# Patient Record
Sex: Male | Born: 1970 | Race: White | Hispanic: Yes | Marital: Married | Smoking: Never smoker
Health system: Southern US, Community
[De-identification: ages and names within clinical notes are randomized; demographics above are authoritative.]

## PROBLEM LIST (undated history)

## (undated) DIAGNOSIS — A048 Other specified bacterial intestinal infections: Secondary | ICD-10-CM

## (undated) HISTORY — DX: Other specified bacterial intestinal infections: A04.8

---

## 1997-09-10 HISTORY — PX: OTHER SURGICAL HISTORY: SHX169

## 2001-10-11 DIAGNOSIS — A048 Other specified bacterial intestinal infections: Secondary | ICD-10-CM

## 2001-10-11 HISTORY — DX: Other specified bacterial intestinal infections: A04.8

## 2001-11-03 ENCOUNTER — Encounter: Admission: RE | Admit: 2001-11-03 | Discharge: 2001-11-03 | Payer: Self-pay | Admitting: Family Medicine

## 2001-12-01 ENCOUNTER — Encounter: Admission: RE | Admit: 2001-12-01 | Discharge: 2001-12-01 | Payer: Self-pay | Admitting: Family Medicine

## 2002-02-02 ENCOUNTER — Encounter: Admission: RE | Admit: 2002-02-02 | Discharge: 2002-02-02 | Payer: Self-pay | Admitting: Family Medicine

## 2002-02-23 ENCOUNTER — Encounter: Admission: RE | Admit: 2002-02-23 | Discharge: 2002-02-23 | Payer: Self-pay | Admitting: Family Medicine

## 2002-08-10 ENCOUNTER — Encounter: Admission: RE | Admit: 2002-08-10 | Discharge: 2002-08-10 | Payer: Self-pay | Admitting: Family Medicine

## 2003-08-03 ENCOUNTER — Encounter: Admission: RE | Admit: 2003-08-03 | Discharge: 2003-08-03 | Payer: Self-pay | Admitting: Family Medicine

## 2003-10-11 ENCOUNTER — Encounter: Admission: RE | Admit: 2003-10-11 | Discharge: 2003-10-11 | Payer: Self-pay | Admitting: Family Medicine

## 2004-01-05 ENCOUNTER — Encounter: Admission: RE | Admit: 2004-01-05 | Discharge: 2004-01-05 | Payer: Self-pay | Admitting: Family Medicine

## 2004-05-09 ENCOUNTER — Encounter: Admission: RE | Admit: 2004-05-09 | Discharge: 2004-05-09 | Payer: Self-pay | Admitting: Family Medicine

## 2006-03-25 ENCOUNTER — Ambulatory Visit: Payer: Self-pay | Admitting: Family Medicine

## 2006-11-07 DIAGNOSIS — K219 Gastro-esophageal reflux disease without esophagitis: Secondary | ICD-10-CM | POA: Insufficient documentation

## 2007-06-02 ENCOUNTER — Encounter (INDEPENDENT_AMBULATORY_CARE_PROVIDER_SITE_OTHER): Payer: Self-pay | Admitting: *Deleted

## 2007-10-27 ENCOUNTER — Ambulatory Visit: Payer: Self-pay | Admitting: Family Medicine

## 2007-10-27 DIAGNOSIS — M549 Dorsalgia, unspecified: Secondary | ICD-10-CM | POA: Insufficient documentation

## 2007-11-24 ENCOUNTER — Ambulatory Visit: Payer: Self-pay | Admitting: Family Medicine

## 2007-11-24 DIAGNOSIS — B351 Tinea unguium: Secondary | ICD-10-CM | POA: Insufficient documentation

## 2007-11-24 DIAGNOSIS — H43399 Other vitreous opacities, unspecified eye: Secondary | ICD-10-CM | POA: Insufficient documentation

## 2007-11-24 LAB — CONVERTED CEMR LAB
Chlamydia, Swab/Urine, PCR: NEGATIVE
Cholesterol: 200 mg/dL (ref 0–200)
GC Probe Amp, Urine: NEGATIVE
HDL: 49 mg/dL (ref >39)
LDL Cholesterol: 121 mg/dL — ABNORMAL HIGH (ref 0–99)
Triglycerides: 150 mg/dL — ABNORMAL HIGH (ref <150)

## 2007-11-25 ENCOUNTER — Encounter: Payer: Self-pay | Admitting: Family Medicine

## 2008-03-16 ENCOUNTER — Ambulatory Visit: Payer: Self-pay | Admitting: Family Medicine

## 2008-03-16 DIAGNOSIS — J1189 Influenza due to unidentified influenza virus with other manifestations: Secondary | ICD-10-CM | POA: Insufficient documentation

## 2008-03-17 ENCOUNTER — Emergency Department (HOSPITAL_COMMUNITY): Admission: EM | Admit: 2008-03-17 | Discharge: 2008-03-17 | Payer: Self-pay | Admitting: Emergency Medicine

## 2008-06-18 ENCOUNTER — Ambulatory Visit: Payer: Self-pay | Admitting: Family Medicine

## 2009-07-14 ENCOUNTER — Encounter: Payer: Self-pay | Admitting: Family Medicine

## 2009-07-14 ENCOUNTER — Ambulatory Visit (HOSPITAL_COMMUNITY): Admission: RE | Admit: 2009-07-14 | Discharge: 2009-07-14 | Payer: Self-pay | Admitting: Family Medicine

## 2009-07-14 ENCOUNTER — Ambulatory Visit: Payer: Self-pay | Admitting: Family Medicine

## 2009-07-14 DIAGNOSIS — R079 Chest pain, unspecified: Secondary | ICD-10-CM | POA: Insufficient documentation

## 2009-07-21 ENCOUNTER — Encounter: Payer: Self-pay | Admitting: Family Medicine

## 2010-10-15 ENCOUNTER — Encounter: Payer: Self-pay | Admitting: *Deleted

## 2012-01-21 ENCOUNTER — Encounter: Payer: Self-pay | Admitting: Family Medicine

## 2012-01-22 NOTE — Progress Notes (Signed)
Opened by accident. Can you close this?

## 2012-01-22 NOTE — Progress Notes (Signed)
This encounter was created in error - please disregard.

## 2012-02-05 ENCOUNTER — Ambulatory Visit (INDEPENDENT_AMBULATORY_CARE_PROVIDER_SITE_OTHER): Payer: Self-pay | Admitting: Family Medicine

## 2012-02-05 ENCOUNTER — Encounter: Payer: Self-pay | Admitting: Family Medicine

## 2012-02-05 VITALS — BP 114/80 | HR 65 | Temp 98.3°F | Ht 65.0 in | Wt 137.0 lb

## 2012-02-05 DIAGNOSIS — K044 Acute apical periodontitis of pulpal origin: Secondary | ICD-10-CM

## 2012-02-05 DIAGNOSIS — K047 Periapical abscess without sinus: Secondary | ICD-10-CM | POA: Insufficient documentation

## 2012-02-05 MED ORDER — PENICILLIN V POTASSIUM 500 MG PO TABS
500.0000 mg | ORAL_TABLET | Freq: Three times a day (TID) | ORAL | Status: AC
Start: 2012-02-05 — End: 2012-02-15

## 2012-02-05 NOTE — Assessment & Plan Note (Signed)
Infected wisdom tooth

## 2012-02-05 NOTE — Patient Instructions (Addendum)
Tome toda la penicilina para 10 dia. Continue Acetaminophen for pain.  Entre una consulta con la dentista. Si mejora la infeccion completamente puede cancelarla cita. Hay una cola larga para visita con la dentista  We will enter a consult with the dentist. Call to cancel it if your tooth infection goes away.

## 2012-02-05 NOTE — Progress Notes (Signed)
  Subjective:    Patient ID: Luke Sloan, male    DOB: 08/22/1971, 41 y.o.   MRN: [id number]  HPI Three weeks of left lower posterior molar pain. Felt something pop and pus drained inside.Mild jaw swelling has decreased.  right lower molar has a filling, but isn't painful.  No fever or chills  Out of work from furniture delivery  Review of Systems     Objective:   Physical Exam  Constitutional: He appears well-developed and well-nourished.  HENT:  Right Ear: External ear normal.  Left Ear: External ear normal.       left lower 3rd molar is without apparent caries, but has purulence and inflammation on the lateral side. No operculum  Cardiovascular: Normal rate and regular rhythm.   Pulmonary/Chest: Effort normal and breath sounds normal.  Lymphadenopathy:    He has no cervical adenopathy.  Psychiatric: He has a normal mood and affect.          Assessment & Plan:

## 2012-02-21 ENCOUNTER — Telehealth: Payer: Self-pay | Admitting: Family Medicine

## 2012-02-21 NOTE — Telephone Encounter (Signed)
Called pt and informed that I did fax a referral to the Dental Clinic.  Lorenda Hatchet, Renato Battles

## 2012-02-21 NOTE — Telephone Encounter (Signed)
Patient now has the [facility] card and needs a referral to the dental clinic. He has a toothache and needs to be seen asap.

## 2012-03-12 ENCOUNTER — Telehealth: Payer: Self-pay | Admitting: *Deleted

## 2012-03-12 ENCOUNTER — Encounter: Payer: Self-pay | Admitting: Family Medicine

## 2012-03-12 ENCOUNTER — Ambulatory Visit (INDEPENDENT_AMBULATORY_CARE_PROVIDER_SITE_OTHER): Payer: Self-pay | Admitting: Family Medicine

## 2012-03-12 VITALS — BP 107/72 | HR 69 | Temp 97.6°F | Ht 65.0 in | Wt 143.0 lb

## 2012-03-12 DIAGNOSIS — K044 Acute apical periodontitis of pulpal origin: Secondary | ICD-10-CM

## 2012-03-12 DIAGNOSIS — K047 Periapical abscess without sinus: Secondary | ICD-10-CM

## 2012-03-12 MED ORDER — CLINDAMYCIN HCL 300 MG PO CAPS: 300.0000 mg | ORAL_CAPSULE | Freq: Four times a day (QID) | ORAL | Status: DC

## 2012-03-12 MED ORDER — CLINDAMYCIN HCL 300 MG PO CAPS: 300.0000 mg | ORAL_CAPSULE | Freq: Four times a day (QID) | ORAL | Status: AC

## 2012-03-12 NOTE — Assessment & Plan Note (Addendum)
Given patient's report of pus and drainage from inflamed area, as well as with mouth exam showing significant inflammation around the left lower molar, dental abscess is likely. As such will start patient on clindamycin 300mg  qid for 3 weeks or longer if needed. He will need to be seen by the dental clinic very soon to get this further evaluated.  Patient to continue with ibuprofen and tylenol for analgesia.  Patient to return if any fever or increased swelling and draining

## 2012-03-12 NOTE — Progress Notes (Signed)
Patient ID: Luke Sloan, male   DOB: 06-Nov-1970, 41 y.o.   MRN: [id number] Patient ID: Luke Sloan    DOB: 1971/07/29, 41 y.o.   MRN: [id number] --- Subjective:  Luke Sloan is a 41 y.o.male who presents with left sided tooth pain.   Time period of: 4 weeks of left lower molar pain. Has been treated with penicillin x 10 days. Finished course on Wednesday. Recurrence of pain since Friday. Swelling since Sunday. Drained pus from swollen area yesterday. + chills. No objective fever. Ibuprofen and tylenol with minimal relief. Left sided headache associated with this.   ROS: see HPI Past Medical History: reviewed and updated medications and allergies.   Objective: Filed Vitals:   03/12/12 1003  BP: 107/72  Pulse: 69  Temp: 97.6 F (36.4 C)    Physical Examination:   General: no acute distress, appears stated age Mouth: left lower gingival tissue inflamed and swollen and tender with palpation. No swelling or fluctuence appreciated along buccal wall. Filling in place in left lower first molar. No obvious cavity appreciated.

## 2012-03-12 NOTE — Telephone Encounter (Signed)
Called dental clinic and left detailed message. Re: pt's dental referral. It was faxed on 02-21-12. Pt finished ABX and needs urgent appointment. [phone]). Waiting for call back. Lorenda Hatchet, Renato Battles

## 2012-03-17 ENCOUNTER — Telehealth: Payer: Self-pay | Admitting: *Deleted

## 2012-03-17 NOTE — Telephone Encounter (Signed)
Spoke with Asher Muir at the [facility] and faxed the referral to her attention. They will look into it. Please let pt know. Lorenda Hatchet, Renato Battles

## 2012-03-18 NOTE — Telephone Encounter (Signed)
I spoke with wife, [facility] contacted pt and pt has an appt on 03/31/12. Thank you very much Thekla, Pt is very happy with our services.  Marines

## 2012-06-09 ENCOUNTER — Ambulatory Visit (INDEPENDENT_AMBULATORY_CARE_PROVIDER_SITE_OTHER): Payer: Self-pay | Admitting: Family Medicine

## 2012-06-09 ENCOUNTER — Encounter: Payer: Self-pay | Admitting: Family Medicine

## 2012-06-09 VITALS — BP 120/77 | HR 64 | Temp 98.5°F | Ht 65.0 in | Wt 139.0 lb

## 2012-06-09 DIAGNOSIS — Z23 Encounter for immunization: Secondary | ICD-10-CM

## 2012-06-09 DIAGNOSIS — Z0000 Encounter for general adult medical examination without abnormal findings: Secondary | ICD-10-CM

## 2012-06-09 NOTE — Progress Notes (Signed)
  Subjective:    Patient ID: Luke Sloan, male    DOB: May 05, 1971, 41 y.o.   MRN: [id number]  HPI He would like a general check-up. No acute problems.    Review of Systems  Constitutional: Negative for fever and activity change.  HENT: Negative for hearing loss, congestion and rhinorrhea.   Respiratory: Negative for apnea.   Cardiovascular: Negative for chest pain.  Gastrointestinal: Negative for abdominal pain.  Genitourinary: Negative for dysuria.  Musculoskeletal: Negative for arthralgias.  Skin: Negative for rash.  Neurological: Negative for syncope.  Hematological: Negative for adenopathy.  Psychiatric/Behavioral: Negative for disturbed wake/sleep cycle.       Objective:   Physical Exam  Constitutional: He is oriented to person, place, and time. He appears well-developed and well-nourished.  HENT:  Head: Normocephalic.  Right Ear: External ear normal.  Left Ear: External ear normal.  Nose: Nose normal.  Mouth/Throat: Oropharynx is clear and moist.  Eyes: Conjunctivae normal and EOM are normal. Pupils are equal, round, and reactive to light.       Mild pterygium left eye  Neck: Normal range of motion. No thyromegaly present.  Cardiovascular: Normal rate and regular rhythm.   Pulmonary/Chest: Effort normal and breath sounds normal.  Abdominal: Soft. Bowel sounds are normal. He exhibits no distension and no mass. There is no tenderness.  Genitourinary: Penis normal.       Testes normal No hernia  Musculoskeletal: Normal range of motion.  Lymphadenopathy:    He has no cervical adenopathy.  Neurological: He is alert and oriented to person, place, and time.  Skin: Skin is warm and dry.  Psychiatric: He has a normal mood and affect. His behavior is normal. Judgment and thought content normal.          Assessment & Plan:

## 2012-06-09 NOTE — Progress Notes (Signed)
Interpreter Luke Sloan for [facility] 

## 2012-06-09 NOTE — Patient Instructions (Addendum)
Voy a mandarle una carta con los [employer]  I will send you a letter with your lab results.   La examinacion esta normal. Your exam today is normal.  Va a recibir una vacuna para gripe y tetanos hoy.

## 2012-06-09 NOTE — Assessment & Plan Note (Signed)
Normal exam.

## 2012-06-10 ENCOUNTER — Encounter: Payer: Self-pay | Admitting: Family Medicine

## 2012-06-10 LAB — COMPREHENSIVE METABOLIC PANEL
Albumin: 4.8 g/dL (ref 3.5–5.2)
Alkaline Phosphatase: 55 U/L (ref 39–117)
BUN: 14 mg/dL (ref 6–23)
CO2: 35 mEq/L — ABNORMAL HIGH (ref 19–32)
Calcium: 10.2 mg/dL (ref 8.4–10.5)
Chloride: 102 mEq/L (ref 96–112)
Glucose, Bld: 57 mg/dL — ABNORMAL LOW (ref 70–99)
Potassium: 4 mEq/L (ref 3.5–5.3)
Sodium: 141 mEq/L (ref 135–145)
Total Protein: 7.8 g/dL (ref 6.0–8.3)

## 2012-06-10 LAB — CBC
Hemoglobin: 13.8 g/dL (ref 13.0–17.0)
MCH: 30.2 pg (ref 26.0–34.0)
Platelets: 217 10*3/uL (ref 150–400)
RBC: 4.57 MIL/uL (ref 4.22–5.81)
WBC: 8.5 10*3/uL (ref 4.0–10.5)

## 2012-06-10 LAB — LDL CHOLESTEROL, DIRECT: Direct LDL: 85 mg/dL

## 2014-06-07 ENCOUNTER — Ambulatory Visit (INDEPENDENT_AMBULATORY_CARE_PROVIDER_SITE_OTHER): Payer: BC Managed Care – PPO | Admitting: Family Medicine

## 2014-06-07 ENCOUNTER — Encounter: Payer: Self-pay | Admitting: Family Medicine

## 2014-06-07 VITALS — BP 118/74 | HR 62 | Temp 98.5°F | Ht 65.0 in | Wt 139.0 lb

## 2014-06-07 DIAGNOSIS — E7800 Pure hypercholesterolemia, unspecified: Secondary | ICD-10-CM | POA: Insufficient documentation

## 2014-06-07 DIAGNOSIS — Z23 Encounter for immunization: Secondary | ICD-10-CM | POA: Diagnosis not present

## 2014-06-07 DIAGNOSIS — Z0000 Encounter for general adult medical examination without abnormal findings: Secondary | ICD-10-CM | POA: Diagnosis not present

## 2014-06-07 DIAGNOSIS — R1011 Right upper quadrant pain: Secondary | ICD-10-CM | POA: Insufficient documentation

## 2014-06-07 NOTE — Progress Notes (Signed)
Subjective:    Patient ID: Luke Sloan, male    DOB: [birth year], 43 y.o.   MRN: [id number]  HPI: Pt presents to clinic for his annual physical exam. In-person interpretation service utilized (pt is a native of [place]). Pt generally feels well without specific complaints, other than a right-sided pain. Pt reports a tight right-sided abdominal pain that comes and goes for about 2 months. Currently does not have any pain, and last had it about 1 week ago. He denies N/V, heartburn or other abdominal pain or change in bowel habits. Pt is a never smoker. He drinks 2-3 beers on the weekends. This seems to exacerbate the abdominal pain.  Family History  Problem Relation Age of Onset  . Cancer Father     gastric    Past Medical History  Diagnosis Date  . Helicobacter pylori infection 10/2001    treated    Past Surgical History  Procedure Laterality Date  . Laceration head  1999    History   Social History  . Marital Status: Married    Spouse Name: N/A    Number of Children: 1  . Years of Education: 6   Occupational History  . Not on file.   Social History Main Topics  . Smoking status: Never Smoker   . Smokeless tobacco: Not on file  . Alcohol Use: 1.0 oz/week    2 drink(s) per week  . Drug Use: Not on file  . Sexual Activity: Yes    Partners: Female   Other Topics Concern  . Not on file   Social History Narrative   Native of [place], came to [place]. At age 80   Roofing and construction   Lives with Liz [place]   Boy born in 2003    In addition to the above documentation, pt's PMH, surgical history, FH, and SH all reviewed and updated where appropriate in the EMR. I have also reviewed and updated the pt's allergies and current medications as appropriate.  Review of Systems: As above. Otherwise, full 12-system ROS was reviewed and all negative.     Objective:   Physical Exam BP 118/74  Pulse 62  Temp(Src) 98.5 F (36.9 C) (Oral)  Ht 5'  5" (1.651 m)  Wt 139 lb (63.05 kg)  BMI 23.13 kg/m2 Gen: well-appearing adult male in NAD HEENT: [place]/AT, sclerae/conjunctivae clear, no lid lag, EOMI, PERRLA   MMM, posterior oropharynx clear, no cervical lymphadenopathy  neck supple with full ROM, no masses appreciated; thyroid not enlarged  Cardio: RRR, no murmur appreciated; distal pulses intact/symmetric Pulm: CTAB, no wheezes, normal WOB  Abd: soft, nondistended, BS+, no HSM Ext: warm/well-perfused, no cyanosis/clubbing/edema MSK: strength 5/5 in all four extremities, no frank joint deformity/effusion  normal ROM to all four extremities with no point muscle/bony tenderness in spine Neuro/Psych: alert/oriented, sensation grossly intact; normal gait/balance  mood euthymic with congruent affect     Assessment & Plan:  43yo generally healthy male with intermittent abdominal pain - history of H.pylori but no current GERD-type symptoms and completely normal abdominal exam - defer any imaging for now but will check CBC and CMP - if any abnormalities or continued pain, will instruct pt to f/u with PCP and would recommend further work-up (?abd [place], ?further labs)  Anticipatory guidance / Risk factor reduction - counseled briefly on maintenance of healthy weight to reduce risk of LDL, HTN, DM, etc - encouraged continued abstinence from tobacco use and limited EtOH consumption  Immunization / screening /  ancillary studies  - CBC, CMP drawn today (as above) - lipid panel drawn today (LDL elevated in the past) - flu shot given, today  Follow up in 1 year for next wellness visit, or sooner if needed. Note FYI to Dr. Dartha Lodge, MD PGY-3, [facility] 06/07/2014, 2:16 PM

## 2014-06-07 NOTE — Patient Instructions (Signed)
Thank you for coming in, today!  I think everything looks fine today. I will check some basic lab work today. I will write you a letter or call you with the result.  If you continue to have the belly pain, come back to see Dr. Ree Kida. Otherwise you can come back to see him in about 1 year. Please feel free to call with any questions or concerns at any time, at [phone]. --Dr. Venetia Maxon

## 2014-06-08 ENCOUNTER — Encounter: Payer: Self-pay | Admitting: Family Medicine

## 2014-06-08 LAB — LIPID PANEL
CHOL/HDL RATIO: 3.3 ratio
Cholesterol: 166 mg/dL (ref 0–200)
HDL: 50 mg/dL (ref >39)
LDL CALC: 78 mg/dL (ref 0–99)
Triglycerides: 192 mg/dL — ABNORMAL HIGH (ref <150)
VLDL: 38 mg/dL (ref 0–40)

## 2014-06-08 LAB — COMPREHENSIVE METABOLIC PANEL
ALT: 18 U/L (ref 0–53)
AST: 19 U/L (ref 0–37)
Albumin: 4.6 g/dL (ref 3.5–5.2)
Alkaline Phosphatase: 67 U/L (ref 39–117)
BILIRUBIN TOTAL: 0.6 mg/dL (ref 0.2–1.2)
BUN: 20 mg/dL (ref 6–23)
CO2: 28 meq/L (ref 19–32)
Calcium: 9.9 mg/dL (ref 8.4–10.5)
Chloride: 99 mEq/L (ref 96–112)
Creat: 0.79 mg/dL (ref 0.50–1.35)
Glucose, Bld: 89 mg/dL (ref 70–99)
Potassium: 4 mEq/L (ref 3.5–5.3)
SODIUM: 136 meq/L (ref 135–145)
Total Protein: 7.5 g/dL (ref 6.0–8.3)

## 2014-06-08 LAB — CBC
HCT: 40.8 % (ref 39.0–52.0)
HEMOGLOBIN: 14.1 g/dL (ref 13.0–17.0)
MCH: 30.8 pg (ref 26.0–34.0)
MCHC: 34.6 g/dL (ref 30.0–36.0)
MCV: 89.1 fL (ref 78.0–100.0)
PLATELETS: 209 10*3/uL (ref 150–400)
RBC: 4.58 MIL/uL (ref 4.22–5.81)
RDW: 13.8 % (ref 11.5–15.5)
WBC: 8.3 10*3/uL (ref 4.0–10.5)

## 2015-04-21 ENCOUNTER — Emergency Department (HOSPITAL_COMMUNITY): Payer: BLUE CROSS/BLUE SHIELD

## 2015-04-21 ENCOUNTER — Encounter (HOSPITAL_COMMUNITY): Payer: Self-pay | Admitting: Emergency Medicine

## 2015-04-21 ENCOUNTER — Emergency Department (HOSPITAL_COMMUNITY)
Admission: EM | Admit: 2015-04-21 | Discharge: 2015-04-21 | Disposition: A | Discharge status: Home / Self Care | Payer: BLUE CROSS/BLUE SHIELD | Attending: Emergency Medicine | Admitting: Emergency Medicine

## 2015-04-21 DIAGNOSIS — Z8619 Personal history of other infectious and parasitic diseases: Secondary | ICD-10-CM | POA: Diagnosis not present

## 2015-04-21 DIAGNOSIS — Z79899 Other long term (current) drug therapy: Secondary | ICD-10-CM | POA: Insufficient documentation

## 2015-04-21 DIAGNOSIS — K529 Noninfective gastroenteritis and colitis, unspecified: Secondary | ICD-10-CM | POA: Insufficient documentation

## 2015-04-21 DIAGNOSIS — R1110 Vomiting, unspecified: Secondary | ICD-10-CM | POA: Diagnosis present

## 2015-04-21 LAB — COMPREHENSIVE METABOLIC PANEL
ALBUMIN: 4 g/dL (ref 3.5–5.0)
ALT: 37 U/L (ref 17–63)
AST: 30 U/L (ref 15–41)
Alkaline Phosphatase: 65 U/L (ref 38–126)
Anion gap: 9 (ref 5–15)
BILIRUBIN TOTAL: 1.1 mg/dL (ref 0.3–1.2)
BUN: 22 mg/dL — ABNORMAL HIGH (ref 6–20)
CO2: 23 mmol/L (ref 22–32)
CREATININE: 0.91 mg/dL (ref 0.61–1.24)
Calcium: 9.2 mg/dL (ref 8.9–10.3)
Chloride: 104 mmol/L (ref 101–111)
GFR calc Af Amer: >60 mL/min (ref >60)
Glucose, Bld: 139 mg/dL — ABNORMAL HIGH (ref 65–99)
Potassium: 3.8 mmol/L (ref 3.5–5.1)
Sodium: 136 mmol/L (ref 135–145)
Total Protein: 7.4 g/dL (ref 6.5–8.1)

## 2015-04-21 LAB — URINALYSIS, ROUTINE W REFLEX MICROSCOPIC
Bilirubin Urine: NEGATIVE
GLUCOSE, UA: NEGATIVE mg/dL
Hgb urine dipstick: NEGATIVE
Ketones, ur: NEGATIVE mg/dL
Leukocytes, UA: NEGATIVE
NITRITE: NEGATIVE
Protein, ur: NEGATIVE mg/dL
Specific Gravity, Urine: 1.021 (ref 1.005–1.030)
UROBILINOGEN UA: 0.2 mg/dL (ref 0.0–1.0)
pH: 6.5 (ref 5.0–8.0)

## 2015-04-21 LAB — CBC
HCT: 40.5 % (ref 39.0–52.0)
HEMOGLOBIN: 14.5 g/dL (ref 13.0–17.0)
MCH: 31.3 pg (ref 26.0–34.0)
MCHC: 35.8 g/dL (ref 30.0–36.0)
MCV: 87.3 fL (ref 78.0–100.0)
Platelets: 208 10*3/uL (ref 150–400)
RBC: 4.64 MIL/uL (ref 4.22–5.81)
RDW: 13.1 % (ref 11.5–15.5)
WBC: 13.7 10*3/uL — ABNORMAL HIGH (ref 4.0–10.5)

## 2015-04-21 LAB — LIPASE, BLOOD: Lipase: 24 U/L (ref 22–51)

## 2015-04-21 MED ORDER — ONDANSETRON HCL 4 MG/2ML IJ SOLN
4.0000 mg | Freq: Once | INTRAMUSCULAR | Status: AC | PRN
  Administered 2015-04-21: 4 mg via INTRAVENOUS
  Filled 2015-04-21: qty 2

## 2015-04-21 MED ORDER — SODIUM CHLORIDE 0.9 % IV BOLUS (SEPSIS)
1000.0000 mL | Freq: Once | INTRAVENOUS | Status: AC
  Administered 2015-04-21: 1000 mL via INTRAVENOUS

## 2015-04-21 MED ORDER — CIPROFLOXACIN HCL 500 MG PO TABS: 500.0000 mg | ORAL_TABLET | Freq: Two times a day (BID) | ORAL | Status: DC

## 2015-04-21 MED ORDER — METRONIDAZOLE 500 MG PO TABS
500.0000 mg | ORAL_TABLET | Freq: Once | ORAL | Status: AC
  Administered 2015-04-21: 500 mg via ORAL
  Filled 2015-04-21: qty 1

## 2015-04-21 MED ORDER — ACETAMINOPHEN 325 MG PO TABS
650.0000 mg | ORAL_TABLET | Freq: Once | ORAL | Status: AC
  Administered 2015-04-21: 650 mg via ORAL
  Filled 2015-04-21: qty 2

## 2015-04-21 MED ORDER — HYDROCODONE-ACETAMINOPHEN 5-325 MG PO TABS: ORAL_TABLET | ORAL | Status: DC

## 2015-04-21 MED ORDER — ONDANSETRON HCL 4 MG PO TABS: 4.0000 mg | ORAL_TABLET | Freq: Three times a day (TID) | ORAL | Status: DC | PRN

## 2015-04-21 MED ORDER — CIPROFLOXACIN HCL 500 MG PO TABS
500.0000 mg | ORAL_TABLET | Freq: Once | ORAL | Status: AC
  Administered 2015-04-21: 500 mg via ORAL
  Filled 2015-04-21: qty 1

## 2015-04-21 MED ORDER — METRONIDAZOLE 500 MG PO TABS: 500.0000 mg | ORAL_TABLET | Freq: Two times a day (BID) | ORAL | Status: DC

## 2015-04-21 MED ORDER — IOHEXOL 300 MG/ML  SOLN
100.0000 mL | Freq: Once | INTRAMUSCULAR | Status: AC | PRN
Start: 2015-04-21 — End: 2015-04-21
  Administered 2015-04-21: 100 mL via INTRAVENOUS

## 2015-04-21 MED ORDER — ONDANSETRON HCL 4 MG/2ML IJ SOLN
4.0000 mg | Freq: Once | INTRAMUSCULAR | Status: AC
  Administered 2015-04-21: 4 mg via INTRAVENOUS
  Filled 2015-04-21: qty 2

## 2015-04-21 MED ORDER — IOHEXOL 300 MG/ML  SOLN
25.0000 mL | Freq: Once | INTRAMUSCULAR | Status: AC | PRN
  Administered 2015-04-21: 25 mL via ORAL

## 2015-04-21 NOTE — ED Provider Notes (Signed)
CSN: [id number]     Arrival date & time 04/21/15  7048 History   First MD Initiated Contact with Patient 04/21/15 [phone]     Chief Complaint  Patient presents with  . Emesis  . Diarrhea     (Consider location/radiation/quality/duration/timing/severity/associated sxs/prior Treatment) HPI   Blood pressure 104/62, pulse 80, temperature 99.2 F (37.3 C), temperature source Oral, resp. rate 18, SpO2 100 %.  Alfonso Carden Struckman is a 44 y.o. male complaining of multiple episodes of nonbloody, nonbilious, non-coffee ground emesis with associated loose stool. This started at 3 AM this morning. Patient endorses chills and diffuse crampy abdominal pain which is 5 out of 10 right now, 10 out of 10 at worst. States that the abdominal pain started before the vomiting. Patient denies melena, hematochezia, fever, sick contacts, recent travel, chest pain, shortness of breath, testicular pain dysuria, hematuria, urinary frequency.  Past Medical History  Diagnosis Date  . Helicobacter pylori infection 10/2001    treated   Past Surgical History  Procedure Laterality Date  . Laceration head  1999   Family History  Problem Relation Age of Onset  . Cancer Father     gastric   Social History  Substance Use Topics  . Smoking status: Never Smoker   . Smokeless tobacco: None  . Alcohol Use: 1.0 oz/week    2 drink(s) per week    Review of Systems  10 systems reviewed and found to be negative, except as noted in the HPI.   Allergies  Review of patient's allergies indicates no known allergies.  Home Medications   Prior to Admission medications   Medication Sig Start Date End Date Taking? Authorizing Provider  ciprofloxacin (CIPRO) 500 MG tablet Take 1 tablet (500 mg total) by mouth every 12 (twelve) hours. 04/21/15   Camree Wigington, PA-C  HYDROcodone-acetaminophen (NORCO/VICODIN) 5-325 MG per tablet Take 1-2 tablets by mouth every 6 hours as needed for pain and/or cough. 04/21/15   Zayonna Ayuso, PA-C  metroNIDAZOLE (FLAGYL) 500 MG tablet Take 1 tablet (500 mg total) by mouth 2 (two) times daily. One tab PO bid x 10 days 04/21/15   Elmyra Ricks Maral Lampe, PA-C  ondansetron (ZOFRAN) 4 MG tablet Take 1 tablet (4 mg total) by mouth every 8 (eight) hours as needed for nausea or vomiting. 04/21/15   Elmyra Ricks Bentli Llorente, PA-C   BP 107/58 mmHg  Pulse 91  Temp(Src) 100.3 F (37.9 C) (Oral)  Resp 22  SpO2 97% Physical Exam  Constitutional: He is oriented to person, place, and time. He appears well-developed and well-nourished. No distress.  HENT:  Head: Normocephalic and atraumatic.  Mouth/Throat: Oropharynx is clear and moist.  Eyes: Conjunctivae and EOM are normal. Pupils are equal, round, and reactive to light.  Neck: Normal range of motion.  Cardiovascular: Normal rate, regular rhythm and intact distal pulses.   Pulmonary/Chest: Effort normal and breath sounds normal. No stridor. No respiratory distress. He has no wheezes. He has no rales. He exhibits no tenderness.  Abdominal: Soft. Bowel sounds are normal. There is no tenderness.  Mild, diffuse tenderness to palpation with no guarding or rebound.  Murphy sign negative, no tenderness to palpation over McBurney's point, Rovsings, Psoas and obturator all negative.   Genitourinary:  Patient is uncircumcised, no testicular pain or swelling, cremasteric reflexes intact bilaterally.   Musculoskeletal: Normal range of motion.  Neurological: He is alert and oriented to person, place, and time.  Skin: He is not diaphoretic.  Psychiatric: He has a normal mood and  affect.  Nursing note and vitals reviewed.   ED Course  Procedures (including critical care time) Labs Review Labs Reviewed  COMPREHENSIVE METABOLIC PANEL - Abnormal; Notable for the following:    Glucose, Bld 139 (*)    BUN 22 (*)    All other components within normal limits  CBC - Abnormal; Notable for the following:    WBC 13.7 (*)    All other components within  normal limits  URINALYSIS, ROUTINE W REFLEX MICROSCOPIC (NOT AT [facility]) - Abnormal; Notable for the following:    Color, Urine AMBER (*)    All other components within normal limits  LIPASE, BLOOD    Imaging Review Ct Abdomen Pelvis W Contrast  04/21/2015   CLINICAL DATA:  Abdominal pain.  Vomiting and diarrhea.  EXAM: CT ABDOMEN AND PELVIS WITH CONTRAST  TECHNIQUE: Multidetector CT imaging of the abdomen and pelvis was performed using the standard protocol following bolus administration of intravenous contrast.  CONTRAST:  164mL OMNIPAQUE IOHEXOL 300 MG/ML  SOLN  COMPARISON:  None.  FINDINGS: Lower chest:  Normal.  Hepatobiliary: Normal.  Pancreas: Normal.  Spleen: Normal.  Adrenals/Urinary Tract: Normal.  Stomach/Bowel: There is extensive fluid in the colon but the colonic mucosa appears normal. Small bowel is normal including the terminal ileum. Appendix is normal. There are few small lymph nodes in the mesentery in the right mid abdomen, felt to be reactive.  Vascular/Lymphatic: Normal.  Reproductive: Normal.  Other: No free air or free fluid.  Musculoskeletal: Normal.  IMPRESSION: Prominent fluid in the colon. Small reactive nodes in the mesentery. Findings suggest acute enteritis.   Electronically Signed   By: Lorriane Shire M.D.   On: 04/21/2015 10:08     EKG Interpretation None      MDM   Final diagnoses:  Enteritis   Filed Vitals:   04/21/15 1100 04/21/15 1121 04/21/15 1200 04/21/15 1202  BP: 106/55 106/55 107/58   Pulse: 88 88 91   Temp:    100.3 F (37.9 C)  TempSrc:    Oral  Resp:  22    SpO2: 99% 99% 97%     Medications  ondansetron (ZOFRAN) injection 4 mg (4 mg Intravenous Given 04/21/15 0641)  sodium chloride 0.9 % bolus 1,000 mL (0 mLs Intravenous Stopped 04/21/15 0747)  sodium chloride 0.9 % bolus 1,000 mL (0 mLs Intravenous Stopped 04/21/15 1046)  iohexol (OMNIPAQUE) 300 MG/ML solution 25 mL (25 mLs Oral Contrast Given 04/21/15 0817)  ondansetron (ZOFRAN) injection 4  mg (4 mg Intravenous Given 04/21/15 0828)  iohexol (OMNIPAQUE) 300 MG/ML solution 100 mL (100 mLs Intravenous Contrast Given 04/21/15 0941)  ciprofloxacin (CIPRO) tablet 500 mg (500 mg Oral Given 04/21/15 1044)  metroNIDAZOLE (FLAGYL) tablet 500 mg (500 mg Oral Given 04/21/15 1044)  acetaminophen (TYLENOL) tablet 650 mg (650 mg Oral Given 04/21/15 1208)    Jatavius Ellenwood Garbers is a pleasant 44 y.o. male presenting with  severe diffuse abdominal pain with multiple episodes of nausea and vomiting. On my abdominal exam there is no focal tenderness, he has no testicular pain or swelling. He has a leukocytosis of 13.7. CT abdomen pelvis shows enteritis.  Patient will be started on Cipro and Flagyl, we've had an extensive discussion of return precautions and patient will follow with his primary care doctor.  Evaluation does not show pathology that would require ongoing emergent intervention or inpatient treatment. Pt is hemodynamically stable and mentating appropriately. Discussed findings and plan with patient/guardian, who agrees with care plan. All questions  answered. Return precautions discussed and outpatient follow up given.   Discharge Medication List as of 04/21/2015 11:51 AM    START taking these medications   Details  ciprofloxacin (CIPRO) 500 MG tablet Take 1 tablet (500 mg total) by mouth every 12 (twelve) hours., Starting 04/21/2015, Until Discontinued, Print    HYDROcodone-acetaminophen (NORCO/VICODIN) 5-325 MG per tablet Take 1-2 tablets by mouth every 6 hours as needed for pain and/or cough., Print    metroNIDAZOLE (FLAGYL) 500 MG tablet Take 1 tablet (500 mg total) by mouth 2 (two) times daily. One tab PO bid x 10 days, Starting 04/21/2015, Until Discontinued, Print    ondansetron (ZOFRAN) 4 MG tablet Take 1 tablet (4 mg total) by mouth every 8 (eight) hours as needed for nausea or vomiting., Starting 04/21/2015, Until Discontinued, [employer], PA-C 04/21/15  1618  Fredia Sorrow, MD 04/27/15 1801

## 2015-04-21 NOTE — ED Notes (Signed)
Pt. reports multiple emesis and diarrhea with generalized abdominal pain onset 1 am this morning , no fever or chills.

## 2015-04-21 NOTE — Discharge Instructions (Signed)
Please follow with your primary care doctor in the next 2 days for a check-up. They must obtain records for further management.   Do not hesitate to return to the Emergency Department for any new, worsening or concerning symptoms.   Take vicodin for breakthrough pain, do not drink alcohol, drive, care for children or do other critical tasks while taking vicodin.  Take your antibiotics as directed and to completion. You should never have any leftover antibiotics! Push fluids and stay well hydrated.    Por favor, siga con su mdico de atencin primaria en los prximos 2 das para un chequeo. Deben obtener los registros para Nutritional therapist .  No dude en volver a la sala de urgencias de cualquier nuevo empeoramiento ni respecto de los sntomas .  Tomar Vicodin para el dolor irruptivo , no beber alcohol, manejar, cuidar a los nios o hacer otras tareas crticas al tomar Vicodin .  Tome sus antibiticos como se indica y Nurse, children's su finalizacin. Nunca se debera tener ningn antibiticos sobrantes ! Introducir lquidos y [employer] hidratado .    Colitis  (Colitis) La colitis es la inflamacin del colon. Puede ser Ardelia Mems afeccin breve o de larga duracin (crnica). La enfermedad de Crohn y la colitis ulcerosa son 2 tipos de colitis crnica. Requieren [employer].  CAUSAS  Hay numerosas causas que originan este problema, entre las que se incluyen:   Virus.  Grmenes (bacterias).  Reacciones a ciertos medicamentos. SNTOMAS   Diarrea.  Sangrado intestinal.  Dolor.  Cristy Hilts.  Devuelve la comida (vmitos).  Cansancio (fatiga).  Prdida de peso.  Obstruccin intestinal. DIAGNSTICO  El diagnstico de colitis se basa en el examen y en anlisis de materia fecal o de sangre. Tambin podra ser necesario tomar radiografas, una tomografa computada y Ardelia Mems colonoscopa.  TRATAMIENTO  El tratamiento puede incluir:   Administracin de lquidos por la vena (va  intravenosa).  Reposo del intestino (no comer ni beber nada durante cierto tiempo).  Medicamentos para Glass blower/designer y la diarrea.  Medicamentos que destruyen grmenes (antibiticos).  Corticoides.  Ciruga. INSTRUCCIONES PARA EL CUIDADO EN EL HOGAR   Descanse lo suficiente.  Beba gran cantidad de lquido para mantener la orina de tono claro o color amarillo plido.  Consuma una dieta bien balanceada.  Comunquese con su mdico para realizar controles segn las indicaciones. SOLICITE ATENCIN MDICA DE INMEDIATO SI:   Comienza a sentir escalofros.  La temperatura oral le sube a ms de 38,9 C (102 F), y no puede bajarla con medicamentos.  Siente debilidad extrema, se desmaya o est deshidratado.  Tiene vmitos persistentes.  Siente mucho dolor en el vientre (abdomen) o elimina heces sanguinolentas o de aspecto alquitranado. ASEGRESE DE QUE:   Comprende estas instrucciones.  Controlar su enfermedad.  Recibir ayuda de inmediato si no mejora o si empeora. Document Released: 08/27/2005 Document Revised: 04/29/2013 [facility] Patient Information 2015 [place]. This information is not intended to replace advice given to you by your health care provider. Make sure you discuss any questions you have with your health care provider.

## 2015-04-21 NOTE — ED Notes (Addendum)
PA aware of temp, verbal for tylenol and ok to d/c.  Pt tolerating fluids ok, no N/V, denies pain.

## 2015-04-21 NOTE — ED Notes (Signed)
Water given to pt for PO challenge per PA.

## 2015-04-21 NOTE — ED Notes (Addendum)
Patient reports some continued nausea and abdominal pain. PA P[place]ta aware. Advised patient could have another dose of zofran

## 2016-02-02 ENCOUNTER — Encounter: Payer: BLUE CROSS/BLUE SHIELD | Admitting: Family Medicine

## 2016-02-03 ENCOUNTER — Encounter: Payer: Self-pay | Admitting: Family Medicine

## 2016-02-03 ENCOUNTER — Ambulatory Visit (INDEPENDENT_AMBULATORY_CARE_PROVIDER_SITE_OTHER): Payer: BLUE CROSS/BLUE SHIELD | Admitting: Family Medicine

## 2016-02-03 VITALS — BP 123/70 | HR 68 | Temp 98.5°F | Ht 65.0 in | Wt 145.6 lb

## 2016-02-03 DIAGNOSIS — G44219 Episodic tension-type headache, not intractable: Secondary | ICD-10-CM

## 2016-02-03 DIAGNOSIS — Z0000 Encounter for general adult medical examination without abnormal findings: Secondary | ICD-10-CM

## 2016-02-03 DIAGNOSIS — G44209 Tension-type headache, unspecified, not intractable: Secondary | ICD-10-CM | POA: Insufficient documentation

## 2016-02-03 LAB — COMPLETE METABOLIC PANEL WITH GFR
ALBUMIN: 4.6 g/dL (ref 3.6–5.1)
ALK PHOS: 68 U/L (ref 40–115)
ALT: 29 U/L (ref 9–46)
AST: 20 U/L (ref 10–40)
BUN: 17 mg/dL (ref 7–25)
CALCIUM: 9.6 mg/dL (ref 8.6–10.3)
CHLORIDE: 102 mmol/L (ref 98–110)
CO2: 30 mmol/L (ref 20–31)
Creat: 0.8 mg/dL (ref 0.60–1.35)
Glucose, Bld: 80 mg/dL (ref 65–99)
Potassium: 4.3 mmol/L (ref 3.5–5.3)
Sodium: 140 mmol/L (ref 135–146)
Total Bilirubin: 0.5 mg/dL (ref 0.2–1.2)
Total Protein: 7.4 g/dL (ref 6.1–8.1)

## 2016-02-03 LAB — LIPID PANEL
CHOL/HDL RATIO: 3.2 ratio (ref <=5.0)
CHOLESTEROL: 177 mg/dL (ref 125–200)
HDL: 56 mg/dL (ref >=40)
LDL Cholesterol: 98 mg/dL (ref <130)
TRIGLYCERIDES: 115 mg/dL (ref <150)
VLDL: 23 mg/dL (ref <30)

## 2016-02-03 LAB — CBC
HCT: 42.2 % (ref 38.5–50.0)
Hemoglobin: 14.2 g/dL (ref 13.2–17.1)
MCH: 30.1 pg (ref 27.0–33.0)
MCHC: 33.6 g/dL (ref 32.0–36.0)
MCV: 89.6 fL (ref 80.0–100.0)
MPV: 10.9 fL (ref 7.5–12.5)
Platelets: 227 10*3/uL (ref 140–400)
RBC: 4.71 MIL/uL (ref 4.20–5.80)
RDW: 13.4 % (ref 11.0–15.0)
WBC: 8.3 10*3/uL (ref 3.8–10.8)

## 2016-02-03 NOTE — Assessment & Plan Note (Signed)
Intermittent tension type headache. Likely related to glue inhalation at work. Neurologic exam unremarkable.  -encouraged use of mask while at work -prn tylenol or ibuprofen.  -if not improved discussed return precautions.

## 2016-02-03 NOTE — Progress Notes (Signed)
45 y.o. year old male presents for preventative visit and annual physical.   Acute Concerns: Headache for a little more than a month. Says it feels heavy. 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 No visual problems- says can see well. No confusion or dizziness. No syncope. No medicine treatment. Band-like headache pain. Headaches last a while and hurt more after work. Drinks a lot of water throughout the day and says urine is not really yellow. Sleeps well. Has not taken any otc medications.   Diet: Eats out maybe 1 time per week, otherwise eats meals prepared at home. Eats vegetables, rice, chicken, some beef, milk, tea.   Exercise: Runs and plays soccer maybe twice a week for about 30 minutes.Job is not very physically demanding.   Sexual History: Currently sexually active with wife. Has been married since 1997. 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   POA/Living Will: Says wife would be POA and says he signed documents to make it official   Social:  Social History   Social History  . Marital Status: Married    Spouse Name: N/A  . Number of Children: 1  . Years of Education: 6   Social History Main Topics  . Smoking status: Never Smoker   . Smokeless tobacco: Not on file  . Alcohol Use: 1.0 oz/week    2 drink(s) per week  . Drug Use: Not on file  . Sexual Activity:    Partners: Female   Other Topics Concern  . Not on file   Social History Narrative   Native of [place], came to [place]. At age 40   Roofing and construction   Lives with Liz [place]   Boy born in 2003    Immunization: Immunization History  Administered Date(s) Administered  . Influenza Split 06/09/2012  . Influenza Whole 06/18/2008  . Influenza,inj,Quad PF,36+ Mos 06/07/2014  . Td 10/11/2001  . Tdap 06/09/2012    Cancer Screening:  Colonoscopy: Not a candidate (Father had gastric cancer, no history of colon cancer).   Prostate: No difficulty with urination,  no stopping or starting, no blood in urine, wakes 1-2 times per night to urinate     Physical Exam: VITALS: Reviewed GEN: Pleasant male, NAD HEENT: Normocephalic, PERRL, EOMI, no scleral icterus, bilateral TM pearly grey, nasal septum midline, MMM, uvula midline, no anterior or posterior lymphadenopathy, no thyromegaly CARDIAC:RRR, S1 and S2 present, no murmur, no heaves/thrills RESP: CTAB, normal effort ABD: soft, no tenderness, normal bowel sounds GU/GYN: Not examined  EXT: No edema, 2+ radial and DP pulses SKIN: no rash Neuro: CN 2-12 intact, strength 5/5 in all extremities, sensation to light touch intact, normal gait.   ASSESSMENT & PLAN: 45 y.o. male presents for annual preventative exam. Please see problem specific assessment and plan.   No problem-specific assessment & plan notes found for this encounter.

## 2016-02-03 NOTE — Assessment & Plan Note (Signed)
45 y/o male presents for preventative exam. -up to date on immunizations -not a candidate for colonoscopy at this time -no urinary complaints (no DRE/PSA today) -encouraged continued healthy diet and regular exercise -counseled on assigning poa/living will.

## 2016-02-03 NOTE — Patient Instructions (Signed)
It was nice meeting you today.  If your head is hurting, you may takeTylenol 325 MG 2-3 times per day or Ibuprofen 200 MG 2-3 times per day as needed. 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  Please come back if the medicine and the mask do not help the headache.   Cefalea tensional (Tension Headache) Una cefalea tensional es una sensacin de dolor o presin que suele manifestarse en la frente y los lados de la cabeza. Este es el tipo ms comn de dolor de [place]. El dolor puede ser sordo o puede sentirse que comprime (constrictivo). Generalmente, no se asocia con nuseas o vmitos y no empeora con la actividad fsica. Las cefaleas tensionales pueden durar de 30 minutos a varios das. CAUSAS Se desconoce la causa exacta de esta afeccin. Suelen comenzar despus de una situacin de estrs, ansiedad o por depresin. Otros factores desencadenantes pueden ser los siguientes:  Alcohol.  Demasiada cafena o abstinencia de cafena.  Infecciones respiratorias, como resfriados, gripes o sinusitis.  Problemas dentales o apretar los dientes.  Fatiga.  Mantener la cabeza y el cuello en la misma posicin durante un perodo prolongado, por ejemplo, al usar la computadora.  Fumar. SNTOMAS Los sntomas de esta afeccin incluyen lo siguiente:  Sensacin de presin alrededor de la cabeza.  Dolor "sordo" en la cabeza.  Dolor que siente sobre la frente y los lados de la cabeza.  Dolor a la [employer] de la cabeza, del cuello y de los hombros. DIAGNSTICO Esta afeccin se puede diagnosticar en funcin de los sntomas y de un examen fsico. Pueden hacerle estudios, como una tomografa computarizada o una resonancia magntica de la cabeza. Estos estudios se indican si los sntomas son graves o fuera de lo comn. TRATAMIENTO Esta afeccin puede tratarse con cambios en el estilo de vida y medicamentos que lo ayuden a Public house manager los  sntomas. [place] Control del [employer] medicamentos de venta libre y los recetados solamente como se lo haya indicado el mdico.  Cuando sienta dolor de cabeza acustese en un cuarto oscuro y tranquilo.  Si se lo indican, aplique hielo sobre la cabeza y la zona del cuello:  Ponga el hielo en una bolsa plstica.  Coloque una toalla entre la piel y la bolsa de hielo.  Coloque el hielo durante 65minutos, 2 a 3veces por Training and development officer.  Utilice una almohadilla trmica o tome una ducha con agua caliente para aplicar calor en la cabeza y la zona del cuello como se lo haya indicado el mdico. Comida y bebida  Mantenga un horario para las comidas.  Limite el consumo de bebidas alcohlicas.  Disminuya el consumo de cafena o deje de consumir cafena. Instrucciones generales  Concurra a todas las visitas de control como se lo haya indicado el mdico. Esto es importante.  Lleve un diario de los dolores de cabeza para Neurosurgeon qu factores pueden desencadenarlos. Por ejemplo, escriba los siguientes datos:  Lo que usted come y Buyer, retail.  Cunto tiempo duerme.  Algn cambio en su dieta o en los medicamentos.  Pruebe algunas tcnicas de relajacin, como los [place].  Limite el estrs.  Sintese derecho y [employer].  No consuma productos que contengan tabaco, incluidos cigarrillos, tabaco de Higher education careers adviser o cigarrillos electrnicos. Si necesita ayuda para dejar de fumar, consulte al [employer].  Haga actividad fsica habitualmente como se lo haya indicado el mdico.  Duerma entre 7  y 9horas o la cantidad de horas que le haya recomendado el mdico. SOLICITE ATENCIN MDICA SI:  Los medicamentos no Dealer los sntomas.  Tiene un dolor de cabeza que es diferente del dolor de cabeza habitual.  Tiene nuseas o vmitos.  Tiene fiebre. SOLICITE ATENCIN MDICA DE INMEDIATO SI:  El dolor se hace cada vez ms intenso.  Ha vomitado  repetidas veces.  Presenta rigidez en el cuello.  Sufre prdida de la visin.  Tiene problemas para hablar.  Siente dolor en el ojo o en el odo.  Presenta debilidad muscular o prdida del control muscular.  Pierde el equilibrio o tiene problemas para Writer.  Sufre mareos o se desmaya.  Se siente confundido.   Esta informacin no tiene Marine scientist el consejo del mdico. Asegrese de hacerle al mdico cualquier pregunta que tenga.   Document Released: 06/06/2005 Document Revised: 05/18/2015 Elsevier Interactive Patient Education [employer].

## 2016-02-07 ENCOUNTER — Encounter: Payer: Self-pay | Admitting: Family Medicine

## 2016-04-10 ENCOUNTER — Ambulatory Visit (INDEPENDENT_AMBULATORY_CARE_PROVIDER_SITE_OTHER): Payer: BLUE CROSS/BLUE SHIELD | Admitting: Family Medicine

## 2016-04-10 VITALS — BP 121/73 | HR 78 | Temp 98.5°F | Wt 147.2 lb

## 2016-04-10 DIAGNOSIS — G44229 Chronic tension-type headache, not intractable: Secondary | ICD-10-CM | POA: Diagnosis not present

## 2016-04-10 NOTE — Patient Instructions (Addendum)
Cefalea tensional (Tension Headache) Una cefalea tensional es una sensacin de dolor o presin que suele manifestarse en la frente y los lados de la cabeza. Este es el tipo ms comn de dolor de [place]. El dolor puede ser sordo o puede sentirse que comprime (constrictivo). Generalmente, no se asocia con nuseas o vmitos y no empeora con la actividad fsica. Las cefaleas tensionales pueden durar de 30 minutos a varios das. CAUSAS Se desconoce la causa exacta de esta afeccin. Suelen comenzar despus de una situacin de estrs, ansiedad o por depresin. Otros factores desencadenantes pueden ser los siguientes:  Alcohol.  Demasiada cafena o abstinencia de cafena.  Infecciones respiratorias, como resfriados, gripes o sinusitis.  Problemas dentales o apretar los dientes.  Fatiga.  Mantener la cabeza y el cuello en la misma posicin durante un perodo prolongado, por ejemplo, al usar la computadora.  Fumar. SNTOMAS Los sntomas de esta afeccin incluyen lo siguiente:  Sensacin de presin alrededor de la cabeza.  Dolor "sordo" en la cabeza.  Dolor que siente sobre la frente y los lados de la cabeza.  Dolor a la [employer] de la cabeza, del cuello y de los hombros. DIAGNSTICO Esta afeccin se puede diagnosticar en funcin de los sntomas y de un examen fsico. Pueden hacerle estudios, como una tomografa computarizada o una resonancia magntica de la cabeza. Estos estudios se indican si los sntomas son graves o fuera de lo comn. TRATAMIENTO Esta afeccin puede tratarse con cambios en el estilo de vida y medicamentos que lo ayuden a Public house manager los sntomas. [employer] Control del [employer] medicamentos de venta libre y los recetados solamente como se lo haya indicado el mdico.  Cuando sienta dolor de cabeza acustese en un cuarto oscuro y tranquilo.  Si se lo indican, aplique hielo sobre la cabeza y la zona del cuello:  Ponga  el hielo en una bolsa plstica.  Coloque una toalla entre la piel y la bolsa de hielo.  Coloque el hielo durante 63minutos, 2 a 3veces por Training and development officer.  Utilice una almohadilla trmica o tome una ducha con agua caliente para aplicar calor en la cabeza y la zona del cuello como se lo haya indicado el mdico. Comida y bebida  Mantenga un horario para las comidas.  Limite el consumo de bebidas alcohlicas.  Disminuya el consumo de cafena o deje de consumir cafena. Instrucciones generales  Concurra a todas las visitas de control como se lo haya indicado el mdico. Esto es importante.  Lleve un diario de los dolores de cabeza para Neurosurgeon qu factores pueden desencadenarlos. Por ejemplo, escriba los siguientes datos:  Lo que usted come y Buyer, retail.  Cunto tiempo duerme.  Algn cambio en su dieta o en los medicamentos.  Pruebe algunas tcnicas de relajacin, como los [place].  Limite el estrs.  [place]intese derecho y [employer].  No consuma productos que contengan tabaco, incluidos cigarrillos, tabaco de Higher education careers adviser o cigarrillos electrnicos. Si necesita ayuda para dejar de fumar, consulte al [employer].  Haga actividad fsica habitualmente como se lo haya indicado el mdico.  Duerma entre 7 y 9horas o la cantidad de horas que le haya recomendado el mdico. SOLICITE ATENCIN MDICA SI:  Los medicamentos no Dealer los sntomas.  Tiene un dolor de cabeza que es diferente del dolor de cabeza habitual.  Tiene nuseas o vmitos.  Tiene fiebre. SOLICITE ATENCIN MDICA DE INMEDIATO SI:  El dolor se hace cada vez ms intenso.  Ha vomitado repetidas veces.  Presenta rigidez en el cuello.  Sufre prdida de la visin.  Tiene problemas para hablar.  Siente dolor en el ojo o en el odo.  Presenta debilidad muscular o prdida del control muscular.  Pierde el equilibrio o tiene problemas para Writer.  Sufre mareos o se desmaya.  Se siente confundido.   Esta  informacin no tiene Marine scientist el consejo del mdico. Asegrese de hacerle al mdico cualquier pregunta que tenga.   Document Released: 06/06/2005 Document Revised: 05/18/2015 Elsevier Interactive Patient Education [employer].

## 2016-04-12 NOTE — Progress Notes (Signed)
Subjective:     Patient ID: Luke Sloan, male   DOB: 03-Jan-1971, 45 y.o.   MRN: [id number]  HPI Mr. Nobel is a 45yo male presenting for headache. - History of chronic headaches. States pain is improved, but now notes constant head pressure. - Located at back and top of head - Present for 3 months - Previously diagnosed with Tension Type Headaches, worsened by glue fumes at work. - Reports when he is driving or in crowds, his headache worsens and he has decreased concentration, increased anxiety, dizziness, numbness in fingertips bilaterally, feels like bugs are crawling on face - Some relief with Ibuprofen - Stopped caffeine as recommended--no change noted - Denies neck or back pain, weakness, numbness - Requests referral to [place] clinic given chronic nature of headache  Review of Systems Per HPI. Other systems negative.    Objective:   Physical Exam  Constitutional: He is oriented to person, place, and time. He appears well-developed and well-nourished. No distress.  Cardiovascular: Normal rate and regular rhythm.   No murmur heard. Pulmonary/Chest: Effort normal. No respiratory distress. He has no wheezes.  Neurological: He is alert and oriented to person, place, and time. He has normal reflexes. No cranial nerve deficit.  Muscle strength 5/5 in upper and lower extremities. Sensation intact.  Psychiatric: He has a normal mood and affect. His behavior is normal.      Assessment and Plan:     Tension-type headache - Continues, although improved from last office visit - Continue Ibuprofen as needed  - Suspect symptoms associated with driving and crowds related to anxiety and panic attacks and are unrelated to Tension Headaches as initially suspected by patient--recommend follow up with PCP. - Referral to Headache Clinic

## 2016-04-12 NOTE — Assessment & Plan Note (Signed)
-   Continues, although improved from last office visit - Continue Ibuprofen as needed  - Suspect symptoms associated with driving and crowds related to anxiety and panic attacks and are unrelated to Tension Headaches as initially suspected by patient--recommend follow up with PCP. - Referral to Headache Clinic

## 2016-05-03 ENCOUNTER — Encounter: Payer: Self-pay | Admitting: Neurology

## 2016-05-03 ENCOUNTER — Ambulatory Visit (INDEPENDENT_AMBULATORY_CARE_PROVIDER_SITE_OTHER): Payer: BLUE CROSS/BLUE SHIELD | Admitting: Neurology

## 2016-05-03 VITALS — BP 115/69 | HR 63 | Ht 65.0 in | Wt 148.6 lb

## 2016-05-03 DIAGNOSIS — R51 Headache: Secondary | ICD-10-CM

## 2016-05-03 DIAGNOSIS — H539 Unspecified visual disturbance: Secondary | ICD-10-CM

## 2016-05-03 DIAGNOSIS — R519 Headache, unspecified: Secondary | ICD-10-CM

## 2016-05-03 DIAGNOSIS — G8929 Other chronic pain: Secondary | ICD-10-CM

## 2016-05-03 NOTE — Patient Instructions (Signed)
Remember to drink plenty of fluid, eat healthy meals and do not skip any meals. Try to eat protein with a every meal and eat a healthy snack such as fruit or nuts in between meals. Try to keep a regular sleep-wake schedule and try to exercise daily, particularly in the form of walking, 20-30 minutes a day, if you can.   As far as diagnostic testing: CT of the head  I would like to see you back if headaches worsen or do not resolve in 2-3 months, sooner if we need to. Please call us with any interim questions, concerns, problems, updates or refill requests.   Our phone number is [phone]. We also have an after hours call service for urgent matters and there is a physician on-call for urgent questions. For any emergencies you know to call 911 or go to the nearest emergency room

## 2016-05-03 NOTE — Progress Notes (Signed)
[id number]    Provider:  Dr Jaynee Eagles Referring Provider: Lupita Dawn, MD Primary Care Physician:  Lupita Dawn, MD  CC:  headache  HPI:  Luke Sloan is a 45 y.o. male here as a referral from Dr. Ree Kida for headaches for 3 months. Past medical history of anxiety and panic attacks. He is non-english speaking and here with interpreter. 3 months ago he started having neck pain radiating into the head. No inciting events or head trauma, no previous illnesses. Ibuprofen helped. He took it for a week and the pain improve. Now he has head pressure like something heavy on his head. Happens almost every day mostly in the afternoon. Unclear what triggers the headache. It improves with lying down. 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 Started with neck tightness. The left eye is worse than his right for vision changes. He has never seen an eye doctor highly encouraged him to have his eyes checked. No other associated symptoms or focal neurologic deficits. He feels nervous. He feels nervous, numbness in the tips of his fingers. No problems sleeping. Only when driving he feels anxious. He denis any other significant nervousness or problems at home or depression. He had panic attacks in the past in 2000 that lasted a year. No significant anxiety since then. No pain. He has a little bit of pressure on the left side of the head. No dizziness or confusion or meningismus. Denies any other focal neurologic deficits. No family history of headaches.  Reviewed notes, labs and imaging from outside physicians, which showed:  CBC normal and CMP normal with normal creatinine 0.80 in May 2017.   Reviewed notes from primary care. The patient was seen in May with headaches that started a month previous. He reported his head feeling heavy, working with filters and a lot of glue for 5 years. At that time he did not report any vision changes, no  confusion or dizziness, no syncope. He felt a bandlike headache pain. Hurt more after work. Denies dehydration. He reported a good diet, exercising regularly. Sleeping well. Exam was normal including head, eyes, ears, nose, throat, cardiac, respiratory, abdomen, extremities, skin, neuro. He was diagnosed with tension type headache likely due to glue inhalation at work area encouraged use of mask while at work and as needed Tylenol or ibuprofen. He was seen again in August 2017 complaining of chronic headache and some head pressure located at the back and top of the head for at least daily in 3 months, worsened by glue fumes at work, headache worsens when driving in crowds, decreased concentration, increased anxiety, dizziness, and fingertips bilaterally feels like tingling. Some relief with ibuprofen. Stop caffeine.  Review of Systems: Patient complains of symptoms per HPI as well as the following symptoms: No chest pain, no shortness of breath. Pertinent negatives per HPI. All others negative.   Social History   Social History  . Marital status: Married    Spouse name: N/A  . Number of children: 1  . Years of education: 6   Occupational History  .      assembling filters for [employer]   Social History Main Topics  . Smoking status: Never Smoker  . Smokeless tobacco: Never Used  . Alcohol use 1.2 oz/week    2 Standard drinks or equivalent per week     Comment: sometimes  . Drug use: No  . Sexual activity: Yes    Partners: Female  Other Topics Concern  . Not on file   Social History Narrative   Native of [place], came to [place]. At age 17   Roofing and construction   Lives with Anson Miracle   Boy born in 2003   Caffeine - tea, one glass a day    Family History  Problem Relation Age of Onset  . Cancer Father     gastric    Past Medical History:  Diagnosis Date  . Helicobacter pylori infection 10/2001   treated    Past Surgical History:  Procedure Laterality Date  .  laceration head  1999    No current outpatient prescriptions on file.   No current facility-administered medications for this visit.     Allergies as of 05/03/2016  . (No Known Allergies)    Vitals: BP 115/69   Pulse 63   Ht 5\' 5"  (1.651 m)   Wt 148 lb 9.6 oz (67.4 kg)   BMI 24.73 kg/m  Last Weight:  Wt Readings from Last 1 Encounters:  05/03/16 148 lb 9.6 oz (67.4 kg)   Last Height:   Ht Readings from Last 1 Encounters:  05/03/16 5\' 5"  (1.651 m)    Physical exam: Exam: Gen: NAD, conversant, well nourised, obese, well groomed                     CV: RRR, no MRG. No Carotid Bruits. No peripheral edema, warm, nontender Eyes: Conjunctivae clear without exudates or hemorrhage  Neuro: Detailed Neurologic Exam  Speech:    Speech is normal; fluent and spontaneous with normal comprehension.  Cognition:    The patient is oriented to person, place, and time;     recent and remote memory intact;     language fluent;     normal attention, concentration,     fund of knowledge Cranial Nerves:    The pupils are equal, round, and reactive to light. The fundi are normal and spontaneous venous pulsations are present. Visual fields are full to finger confrontation. Extraocular movements are intact. Trigeminal sensation is intact and the muscles of mastication are normal. The face is symmetric. The palate elevates in the midline. Hearing intact. Voice is normal. Shoulder shrug is normal. The tongue has normal motion without fasciculations.   Coordination:    Normal finger to nose and heel to shin. Normal rapid alternating movements.   Gait:    Heel-toe and tandem gait are normal.   Motor Observation:    No asymmetry, no atrophy, and no involuntary movements noted. Tone:    Normal muscle tone.    Posture:    Posture is normal. normal erect    Strength:    Strength is V/V in the upper and lower limbs.      Sensation: intact to LT     Reflex Exam:  DTR's:    Deep tendon  reflexes in the upper and lower extremities are normal bilaterally.   Toes:    The toes are downgoing bilaterally.   Clonus:    Clonus is absent.      Assessment/Plan:  45 year old patient with past medical history of anxiety and panic attacks here for persistent headache. Started with neck pain with radiation into the head. Neck pain resolved with ibuprofen helped but continues to have daily pressure headaches with vision changes.  CT of the head Since headache is improving, recommended when necessary Tylenol or Advil. Be careful for GI problems. Do not take every day as this can  cause rebound headaches. If headaches persist may try nortriptyline or amitriptyline at night low-dose. Also recommended wearing a mask at work and avoiding glue fumes or other exacerbating triggers at work. Highly recommend seeing optometrist for vision changes as this could be causing headaches if needs glasses.  Discussed: To prevent or relieve headaches, try the following: Cool Compress. Lie down and place a cool compress on your head.  Avoid headache triggers. If certain foods or odors seem to have triggered your migraines in the past, avoid them. A headache diary might help you identify triggers.  Include physical activity in your daily routine. Try a daily walk or other moderate aerobic exercise.  Manage stress. Find healthy ways to cope with the stressors, such as delegating tasks on your to-do list.  Practice relaxation techniques. Try deep breathing, yoga, massage and visualization.  Eat regularly. Eating regularly scheduled meals and maintaining a healthy diet might help prevent headaches. Also, drink plenty of fluids.  Follow a regular sleep schedule. Sleep deprivation might contribute to headaches Consider biofeedback. With this mind-body technique, you learn to control certain bodily functions - such as muscle tension, heart rate and blood pressure - to prevent headaches or reduce headache pain.     Proceed to emergency room if you experience new or worsening symptoms or symptoms do not resolve, if you have new neurologic symptoms or if headache is severe, or for any concerning symptom.  Cc: Lupita Dawn, MD  Sarina Ill, MD  [facility] [address] [place], [place] [address]  Phone [phone] Fax [phone]

## 2016-06-08 ENCOUNTER — Ambulatory Visit
Admission: RE | Admit: 2016-06-08 | Discharge: 2016-06-08 | Disposition: A | Discharge status: Home / Self Care | Payer: BLUE CROSS/BLUE SHIELD | Source: Ambulatory Visit | Attending: Neurology | Admitting: Neurology

## 2016-06-08 DIAGNOSIS — H539 Unspecified visual disturbance: Secondary | ICD-10-CM | POA: Diagnosis not present

## 2016-06-08 DIAGNOSIS — R51 Headache: Secondary | ICD-10-CM | POA: Diagnosis not present

## 2016-06-08 DIAGNOSIS — R519 Headache, unspecified: Secondary | ICD-10-CM

## 2016-06-11 ENCOUNTER — Telehealth: Payer: Self-pay | Admitting: *Deleted

## 2016-06-11 NOTE — Telephone Encounter (Addendum)
Called [facility] interpreters. ID number:  [id number] Darrick Meigs). He called patient and relayed CT head results via voicemail per Dr Jaynee Eagles note. He gave [place] phone number if he has further questions.

## 2016-06-11 NOTE — Telephone Encounter (Signed)
-----   Message from Melvenia Beam, MD sent at 06/10/2016  7:09 PM EDT ----- CT of the head is normal thanks

## 2016-07-16 ENCOUNTER — Encounter: Payer: Self-pay | Admitting: Family Medicine

## 2016-07-16 ENCOUNTER — Ambulatory Visit (INDEPENDENT_AMBULATORY_CARE_PROVIDER_SITE_OTHER): Payer: BLUE CROSS/BLUE SHIELD | Admitting: Family Medicine

## 2016-07-16 VITALS — BP 132/80 | HR 71 | Temp 98.4°F | Resp 16 | Wt 148.4 lb

## 2016-07-16 DIAGNOSIS — S8991XA Unspecified injury of right lower leg, initial encounter: Secondary | ICD-10-CM | POA: Insufficient documentation

## 2016-07-16 NOTE — Assessment & Plan Note (Addendum)
Patient is here after suffering a right-sided knee injury. After obtaining history and physical exam etiology is concerning for possible meniscal tear (with red zone involvement) with medial quadriceps tear. Due to the patient's high velocity mechanism of injury as well as the reports of nearly immediate swelling of the joint (which has now resolved) there is also some concern for possible fracture/OCD. Patient is not having mechanical symptoms at this time. - X-ray right knee ordered - Encourage regular ice, compression, and NSAIDs - Advised to rest knee for the next 1-2 weeks. Restart physical activity as tolerated. Allow pain to be the guide. - Patient is not having mechanical symptoms at this time and does not have any significant interest in surgical intervention if he does not have to. Because of this I do not feel it necessary to obtain MRI at this time or referral to orthopedic surgery. With that said if patient develops persistent pain or mechanical symptoms I would strongly consider further evaluation with either one of these 2 options.

## 2016-07-16 NOTE — Progress Notes (Signed)
   HPI  CC: right knee injury 2 sundays ago playing soccer. Fell with knee flexed, onto patella. No twisting motion. Didn't feel any pop at the time. Swelled up immediately after (<67min). Swelling went down ~4 days after. Now popping, didn't have before. No catching/locking. Pain is worse on lateral side, but present on medial and lateral inferior aspect.  ROS: No weakness, numbness, or paresthesias. Denies feelings of instability. No fevers, chills.  CC, SH/smoking status, and VS noted  Objective: BP 132/80 (BP Location: Left Arm, Patient Position: Sitting, Cuff Size: Large)   Pulse 71   Temp 98.4 F (36.9 C) (Oral)   Resp 16   Wt 148 lb 6.4 oz (67.3 kg)   SpO2 99%   BMI 24.70 kg/m  Gen: NAD, alert, cooperative. CV: Well-perfused. Resp: Non-labored. Neuro: Sensation intact throughout. Right knee: No bony abnormalities or effusion, obvious ecchymoses from medial thigh tracking distally over patella with some extension of ecchymosis down anterior tibia. Range of motion intact bilaterally. Occasional popping audibly present without evidence of locking or catching. No patellar crepitus. Tenderness noted along bilateral anterior joint lines and anterior medial thigh. Strength intact with knee flexion, extension, leg abduction/adduction, and leg inversion/eversion at the hip (all 5/5 without pain). All 4 ligaments intact with solid endpoints comparable to the left. McMurray's positive for click but negative for significant discomfort.   Assessment and plan:  Knee injury, right, initial encounter Patient is here after suffering a right-sided knee injury. After obtaining history and physical exam etiology is concerning for possible meniscal tear (with red zone involvement) with medial quadriceps tear. Due to the patient's high velocity mechanism of injury as well as the reports of nearly immediate swelling of the joint (which has now resolved) there is also some concern for possible  fracture/OCD. Patient is not having mechanical symptoms at this time. - X-ray right knee ordered - Encourage regular ice, compression, and NSAIDs - Advised to rest knee for the next 1-2 weeks. Restart physical activity as tolerated. Allow pain to be the guide. - Patient is not having mechanical symptoms at this time and does not have any significant interest in surgical intervention if he does not have to. Because of this I do not feel it necessary to obtain MRI at this time or referral to orthopedic surgery. With that said if patient develops persistent pain or mechanical symptoms I would strongly consider further evaluation with either one of these 2 options.   Orders Placed This Encounter  Procedures  . DG Knee Complete 4 Views Right    Standing Status:   Future    Standing Expiration Date:   09/15/2017    Order Specific Question:   Reason for Exam (SYMPTOM  OR DIAGNOSIS REQUIRED)    Answer:   Knee injury and immediate swelling (~9 days ago)    Order Specific Question:   Preferred imaging location?    Answer:   [facility]    Elberta Leatherwood, MD,MS,  PGY3 07/16/2016 6:09 PM

## 2016-07-16 NOTE — Patient Instructions (Signed)
Fractura de meniscos con rehabilitacin fase I (Meniscus Tear With Phase I Rehab) El menisco es un trozo de Database administrator con forma de C, ubicado en la articulacin de la rodilla, entre la tibia y Ship broker. Hay dos meniscos en cada articulacin de la rodilla: interno y externo. El menisco acta como un adaptador entre la tibia y el fmur y permite que los huesos de la tibia y el fmur se ensamblen. Tambin funciona como amortiguador, para reducir el estrs sobre la articulacin de la rodilla y ayudar a suministrar nutrientes al cartlago de Water engineer. Cuando las [employer], los meniscos se endurecen y se vuelven ms vulnerables a las lesiones. Es una lesin frecuente, especialmente en los deportistas de [place]. La fractura de menisco interno es ms comn que la del externo.  SNTOMAS  Dolor en la rodilla, especialmente al permanecer parado sobre la pierna afectada.  Sensibilidad en la lnea de la articulacin.  Inflamacin en la articulacin de la rodilla (efusin), que por lo general comienza entre 1 y 2 das despus de la lesin.  La rodilla se traba, o se queda atrapada la articulacin, lo que ocasiona que no se pueda estirar por completo.  La rodilla se tuerce o se afloja. CAUSAS La ruptura se produce cuando se aplica una fuerza mayor que la que puede soportar el [place] causas ms frecuentes de la lesin son:  [place]olpe directo en la rodilla (traumatismo).  Golpe directo en la rodilla, girar, pivotar o cortar (cambiar rpidamente de direccin al correr), as [employer] al arrodillarse o ponerse en cuclillas.  Sin traumatismo, por un proceso de envejecimiento. LOS RIESGOS AUMENTAN CON:  En los deportes de contacto (ftbol americano, rugby).  Los deportes en los cuales se requiere Forensic scientist (ftbol, lacrosse),o en los que se requieren tapones grandes y que requieren cambios bruscos de direccin (squash, deportes con raqueta, bsquet).  Lesin previa en la  rodilla.  Lesin asociada en la rodilla, en particular lesiones en los ligamentos.  Poca fuerza y flexibilidad. PREVENCIN   Precalentamiento adecuado y elongacin antes de la [place].  Mantener la forma fsica:  Kerry Hough, flexibilidad y resistencia muscular.  Capacidad cardiovascular.  Proteja la rodilla con dispositivos ortopdicos o vendajes compresivos.  Utilice el equipo adecuado (tamao adecuado de los tapones segn la superficie). PRONSTICO Estas fracturas a menudo se curan por s solas. Sin embargo, a menudo requiere [place] y 6 meses de recuperacin.  COMPLICACIONES RELACIONADAS  La recurrencia de los sntomas puede dar como resultado un problema crnico.  Traumatismo repetido de la rodilla, en especial si se retoma el deporte rpidamente luego de la lesin o la ciruga.  Progresin del desgarro (se agranda) si no se trata.  Artritis en la rodilla luego de algunos aos (con o sin [place]).  Complicaciones en la ciruga que incluyen infeccin, hemorragia, lesiones a los nervios (adormecimiento, debilitamiento, parlisis) dolor continuo, se tuerce o se traba la rodilla, el menisco no se cura (si fue reparado), necesidad de [place], y rigidez de la rodilla (prdida de movimiento). TRATAMIENTO El tratamiento inicial incluye el uso de medicamentos y la aplicacin de hielo para reducir Conservation officer, historic buildings y la inflamacin. La utilizacin de [employer] podr ser de [place]. Sin embargo Paediatric nurse peso con la pierna afectada, si el dolor se lo permite. La ciruga se recomienda a menudo como tratamiento definitivo. La ciruga suele realizarse a travs de una incisin cerca de la articulacin (artroscopa). Se elimina la parte desgarrada del menisco y si es posible se repara  el cartlago. Luego de la [place], se inmoviliza la articulacin. Luego de la inmovilizacin es importante completar los ejercicios de fortalecimiento y elongacin para recobrar la fuerza y la amplitud de  movimientos. Los ejercicios pueden Press photographer o con un terapeuta.  MEDICAMENTOS   Si es necesaria la administracin de medicamentos para Conservation officer, historic buildings, se recomiendan los antiinflamatorios no esteroides, como aspirina e ibuprofeno y otros calmantes menores, como acetaminofeno.  No tome medicamentos para el dolor dentro de los 7 das previos a la [place].  El profesional podr prescribirle calmantes si lo considera necesario. Utilcelos como se le indique y slo cuando lo necesite. CALOR Y FRO   El fro (con hielo) debe aplicarse durante 10 a 15 minutos cada 2  3 horas para reducir la inflamacin y Conservation officer, historic buildings e inmediatamente despus de cualquier actividad que agrava los sntomas. Utilice bolsas o un masaje de hielo.  El calor puede usarse antes de Neurosurgeon y [place] fortalecimiento indicadas por el profesional, le fisioterapeuta o Industrial/product designer. Utilice una bolsa trmica o un pao hmedo. SOLICITE ATENCIN MDICA SI:  Los sntomas empeoran o no mejoran en 2 semanas, a pesar de Chiropodist.  Desarrolla nuevos e inexplicables sntomas. (Los medicamentos indicados en el tratamiento le ocasionan efectos secundarios). [place] del menisco, no quirrgico, Fase I Estos son algunos ejercicios iniciales con los que puede comenzar el programa de rehabilitacin hasta que vea nuevamente al profesional que lo asiste o hasta que los sntomas hayan desaparecido. Recuerde:   Los ejercicios iniciales deben ser suaves. Le ayudarn a reestablecer el movimiento sin aumentar la hinchazn.  Al completar estos ejercicios podr realizar movimientos con menos dolor y estar preparado para ejercicios de fortalecimiento ms agresivos de la Fase II.  Para que sea efectiva, cada elongacin debe realizarse durante al menos 30 segundos.  La elongacin nunca debe ser dolorosa. Deber sentir slo un alargamiento o distensin  suave del tejido que estira. [place], activo  Recustese sobre la espalda con las rodillas rectas. (Si esto le ocasiona molestias en la espalda, doble la rodilla opuesta, apoyando el pie en el suelo).  Lentamente deslice el taln hacia sus nalgas, hasta que sienta una sensacin de estiramiento en la zona de la rodilla o el muslo.  Mantenga esta posicin durante __________ segundos. Vuelva lentamente el taln hasta la posicin inicial. Reptalo __________ veces. Realice este ejercicio __________ veces por da.  [place] y extensin de la rodilla, [place], asistida  Sintese en el borde de una mesa o una silla, con los muslos bien apoyados. Puede ser de utilidad colocar una toalla doblada debajo de su muslo derecha / izquierdo.  Flexin (doblar): Coloque el tobillo de su pierna sana sobre el otro tobillo. Use su pierna sana para doblar suavemente su rodilla derecha / izquierdo hasta sentir una suave tensin que cruce sobre su rodilla.  Mantenga esta posicin durante __________ segundos.  Extensin (elongacin): [place] posicin de los tobillos de modo que la pierna Recruitment consultant / izquierdo [place]. Use su pierna sana para doblar suavemente su rodilla derecha / izquierdo hasta sentir una suave tensin que cruce sobre su rodilla.  Mantenga esta posicin durante __________ segundos. Reptalo __________ veces. Realice este ejercicio __________ veces por da. ELONGACIN - Flexin de la rodilla posicin supina  Acustese en el suelo, con el taln derecha / izquierdo tocando ligeramente la pared. (Coloque ambos  pies en la pared, si no utiliza un marco de la puerta.)  Sin hacer esfuerzo, permita que la gravedad haga descender su pie por la pared lentamente hasta sentir un ligero estiramiento en la zona anterior de la rodilla derecha / izquierdo.  Mantenga esta posicin durante __________ segundos. Luego vuelva la pierna a la posicin  inicial usando su pierna sana, si es necesario. Reptalo __________ veces. Realice este estiramiento __________ Vicenta Aly por da.  ELONGACIN - Extensin de la rodilla en posicin sentado  Sintese con su pierna o taln derecha / izquierdo apoyado en otra silla, en una mesita de caf o en un posapis.  Relaje los msculos de la pierna, y permita que la gravedad enderece su rodilla.*  Debe sentir un estiramiento en la rodilla derecha / izquierdo. Mantenga esta posicicin durante __________ segundos. Reptalo __________ veces. Realice este estiramiento __________ Vicenta Aly por da.  *El mdico, fisioterapeuta o Administrator, sports, podrn indicarle que aada __________ Marlyne Beards de resistencia mediante la colocacin de un objeto pesado sobre el muslo, por encima de rtula para profundizar el estiramiento.  EJERCICIOS DE FORTALECIMIENTO - Fractura de meniscos, no quirrgico Fase I Estos ejercicios le ayudarn en la recuperacin de la lesin. Los sntomas podrn desaparecer con o sin mayor intervencin del profesional, el fisioterapeuta o Industrial/product designer. Al completar estos ejercicios, recuerde:   Los msculos pueden ganar la resistencia y la fuerza necesarias para las actividades diarias a travs de ejercicios controlados.  Realice los ejercicios como se lo indic el mdico, el fisioterapeuta o Industrial/product designer. Aumente la resistencia y las repeticiones segn se le haya indicado. FUERZA - Cudriceps, isomtricos  Recustese sobre la espalda con la pierna derecha / izquierdo extendida y la rodilla opuesta flexionada.  Tensione gradualmente los msculos de la zona anterior del muslo derecha / izquierdo. Ver que la rtula se desliza hacia arriba o que se intensifica el hoyuelo que se encuentra justo por arriba de la rodilla. Este movimiento empujar nuevamente la rodilla hacia abajo en direccin al piso, [place] o cama sobre la cual est recostado.  Sostenga de ese modo el msculo, tan apretado como pueda, sin que [employer], durante __________ segundos.  Relaje los msculos lenta y completamente entre cada repeticin. Reptalo __________ veces. Realice este ejercicio __________ veces por da.  FUERZA - Cudriceps, arcos cortos  Acustese [employer]. Coloque un rollo de __________ cm. debajo de la rodilla derecha / izquierdo para que las rodillas se doblen levemente.  Eleve slo la parte inferior de la pierna mediante la tensin de los msculos de la parte frontal del muslo. No permita que el muslo se eleve.  Mantenga esta posicicin durante __________ segundos. Reptalo __________ veces. Realice este ejercicio __________ veces por da.  PESO OPCIONAL EN EL TOBILLO: Comience con ____________________, pero no se exceda de ____________________. Aumente de a 1 libra/0.5 kilograme.  Roland piernas rectas Lo que importa es la calidad! Observe si hay seales de que el msculo cudriceps est trabajando, para estar seguro de que trabaja el fortalecimiento de los msculos correctos y no "haga trampa" mediante la sustitucin de los msculos sanos.  Recustese sobre la espalda con su pierna derecha / izquierdo extendida y la rodilla opuesta doblada.  Tensione gradualmente los msculos de la zona anterior del muslo derecha / izquierdo. Ver que la rtula se desliza hacia arriba o que se intensifica el hoyuelo que se encuentra justo por arriba de la rodilla. El muslo podr temblar un poco.  Tensione  estos msculos an ms y [employer] pierna 10 a 15 cm del piso. Mantenga esta posicin durante __________ segundos.  Mientras mantiene estos msculos tensionados, baje la pierna.  Relaje los msculos lenta y completamente entre cada repeticin. Reptalo __________ veces. Realice este ejercicio __________ veces por da.  [place]  Acustese sobre el estmago con las piernas extendidas. (Si est recostado [employer], puede dejar que los pies cuelguen por el  borde).  Contraiga los msculos de la zona posterior del muslo para doblar su rodilla derecha / izquierdo a 90 grados. Mantenga las caderas planas sobre la cama.  Mantenga esta posicicin durante __________ segundos.  Baje lentamente la pierna hasta la posicin inicial. Reptalo __________ veces. Realice este ejercicio __________ veces por da.  [place], cuclillas  Prese en el marco de la puerta, de modo que los pies y las rodillas queden en lnea con el [place] manos para mantener el equilibrio en el [place], pero no se apoye.  Bjese lentamente, doblando las caderas y [place]. [place] piernas rectas de modo que queden paralelas al marco de la puerta. Pngase en cuclillas slo dentro del lmite en que no sienta dolor en la rodilla. Nunca deje que las caderas lleguen a un nivel inferior de las rodillas.  Vuelva lentamente a la posicin erguida, empujando con las piernas y no con las manos Reptalo __________ [employer]. Realice este ejercicio __________ veces por da.  FUERZA - Isometra cudriceps vasto medial oblicuo  Sintese en una silla con su rodilla derecha / izquierdo ligeramente doblada. Con la punta de los dedos sienta el vasto medial oblicuo arriba de la zona interna de la rodilla. Este msculo interviene en el control de la posicin de la rtula.  Mantenga la punta de los dedos [employer]. Sin mover la pierna, intente llevar la rodilla hacia abajo como si extendiera la pierna. Debe sentir que el vasto medial oblicuo se tensa. Si le resulta difcil, trate primero de hacer el mismo ejercicio con la rodilla sana.  Tense este msculo tanto como pueda sin aumentar el dolor en la rodilla.  Mantenga esta posicin durante __________ segundos. Relaje los msculos lenta y completamente entre cada repeticin. Reptalo __________ veces. Realice este ejercicio __________ veces por da.    Esta informacin no tiene Marine scientist el consejo del  mdico. Asegrese de hacerle al mdico cualquier pregunta que tenga.   Document Released: 06/13/2006 Document Revised: 01/11/2015 Elsevier Interactive Patient Education [employer].

## 2018-02-07 ENCOUNTER — Ambulatory Visit: Payer: BLUE CROSS/BLUE SHIELD | Admitting: Family Medicine

## 2018-02-10 ENCOUNTER — Ambulatory Visit: Payer: BLUE CROSS/BLUE SHIELD | Admitting: Internal Medicine

## 2018-02-13 NOTE — Progress Notes (Signed)
c  Subjective:   Patient ID: Luke Sloan    DOB: 1971-06-12, 47 y.o. male   MRN: [id number]  Luke Sloan is a 47 y.o. male with no significant PMH here for   COUGH Has been coughing for ~1 month, states it comes and goes. 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 Has been working at same [employer] for about 8 years with no issues before. No sick contacts. No history of allergies. No itchy, watery eyes.   Routine physical Patient states he wants to be checked with annual blood work.  Has no specific concerns or other symptoms besides cough listed above.  Patient is up-to-date with healthcare maintenance items.  Review of Systems:  Per HPI.   [place], medications and smoking status reviewed.  Objective:   BP 110/60   Pulse 72   Temp 98.6 F (37 C) (Oral)   Ht 5\' 5"  (1.651 m)   Wt 144 lb (65.3 kg)   SpO2 96%   BMI 23.96 kg/m  Vitals and nursing note reviewed.  General: well nourished, well developed, in no acute distress with non-toxic appearance HEENT: normocephalic, atraumatic, moist mucous membranes. No erythema, exudates noted in throat. Neck: supple, non-tender without lymphadenopathy CV: regular rate and rhythm without murmurs, rubs, or gallops, no lower extremity edema Lungs: clear to auscultation bilaterally with normal work of breathing Abdomen: soft, non-tender, non-distended, no masses or organomegaly palpable, normoactive bowel sounds Skin: warm, dry, no rashes or lesions Extremities: warm and well perfused, normal tone MSK: ROM grossly intact, strength intact,  gait normal Neuro: Alert and oriented, speech normal  Assessment & Plan:   Cough Likely due to environmental irritant, either at work from glue fumes or pollen however does not have a history of allergies.  Patient is not on any medications that could contribute to cough.  Lack of sputum production, congestion, runny nose make viral etiology less likely. Recommended wearing a mask at work to see if symptoms improve.  Recommended staying hydrated to avoid dry scratchy throat.    Advised to return to clinic if symptoms worsen.  Well adult exam Healthcare maintenance items up-to-date.  Will obtain blood work per patient request.  Orders Placed This Encounter  Procedures  . CBC  . Comprehensive metabolic panel  . Lipid Panel  . POCT glycosylated hemoglobin (Hb A1C)   No orders of the defined types were placed in this encounter.   Rory Percy, DO PGY-1, [facility] 02/14/2018 7:00 PM

## 2018-02-14 ENCOUNTER — Ambulatory Visit (INDEPENDENT_AMBULATORY_CARE_PROVIDER_SITE_OTHER): Payer: BLUE CROSS/BLUE SHIELD | Admitting: Family Medicine

## 2018-02-14 ENCOUNTER — Other Ambulatory Visit: Payer: Self-pay

## 2018-02-14 ENCOUNTER — Encounter: Payer: Self-pay | Admitting: Family Medicine

## 2018-02-14 VITALS — BP 110/60 | HR 72 | Temp 98.6°F | Ht 65.0 in | Wt 144.0 lb

## 2018-02-14 DIAGNOSIS — Z0000 Encounter for general adult medical examination without abnormal findings: Secondary | ICD-10-CM | POA: Diagnosis not present

## 2018-02-14 DIAGNOSIS — R05 Cough: Secondary | ICD-10-CM

## 2018-02-14 DIAGNOSIS — R059 Cough, unspecified: Secondary | ICD-10-CM

## 2018-02-14 LAB — POCT GLYCOSYLATED HEMOGLOBIN (HGB A1C): HEMOGLOBIN A1C: 5.1 % (ref 4.0–5.6)

## 2018-02-14 NOTE — Assessment & Plan Note (Signed)
Healthcare maintenance items up-to-date.  Will obtain blood work per patient request.

## 2018-02-14 NOTE — Patient Instructions (Signed)
It was great to see you!  For your cough, - If you find the fumes from the glue at work are making your cough worse, you can wear a mask. - Make sure to drink lots of water to keep throat well hydrated.  - You may have environmental allergens that are bothering your throat as well.  We are checking some labs today, we will call you or send you a letter if they are abnormal.   Take care and seek immediate care sooner if you develop any concerns.   Dr. [facility]

## 2018-02-15 LAB — CBC
HEMATOCRIT: 40.6 % (ref 37.5–51.0)
HEMOGLOBIN: 13.8 g/dL (ref 13.0–17.7)
MCH: 30.3 pg (ref 26.6–33.0)
MCHC: 34 g/dL (ref 31.5–35.7)
MCV: 89 fL (ref 79–97)
Platelets: 196 10*3/uL (ref 150–450)
RBC: 4.55 x10E6/uL (ref 4.14–5.80)
RDW: 13.6 % (ref 12.3–15.4)
WBC: 5.8 10*3/uL (ref 3.4–10.8)

## 2018-02-15 LAB — COMPREHENSIVE METABOLIC PANEL
ALT: 26 IU/L (ref 0–44)
AST: 24 IU/L (ref 0–40)
Albumin/Globulin Ratio: 2 (ref 1.2–2.2)
Albumin: 5 g/dL (ref 3.5–5.5)
Alkaline Phosphatase: 70 IU/L (ref 39–117)
BUN/Creatinine Ratio: 22 — ABNORMAL HIGH (ref 9–20)
BUN: 17 mg/dL (ref 6–24)
Bilirubin Total: 0.3 mg/dL (ref 0.0–1.2)
CO2: 26 mmol/L (ref 20–29)
Calcium: 9.5 mg/dL (ref 8.7–10.2)
Chloride: 101 mmol/L (ref 96–106)
Creatinine, Ser: 0.79 mg/dL (ref 0.76–1.27)
GFR calc Af Amer: 125 mL/min/{1.73_m2} (ref >59)
GFR, EST NON AFRICAN AMERICAN: 108 mL/min/{1.73_m2} (ref >59)
GLOBULIN, TOTAL: 2.5 g/dL (ref 1.5–4.5)
Glucose: 95 mg/dL (ref 65–99)
Potassium: 4.3 mmol/L (ref 3.5–5.2)
SODIUM: 140 mmol/L (ref 134–144)
Total Protein: 7.5 g/dL (ref 6.0–8.5)

## 2018-02-15 LAB — LIPID PANEL
CHOL/HDL RATIO: 3.6 ratio (ref 0.0–5.0)
CHOLESTEROL TOTAL: 189 mg/dL (ref 100–199)
HDL: 52 mg/dL (ref >39)
LDL Calculated: 107 mg/dL — ABNORMAL HIGH (ref 0–99)
Triglycerides: 152 mg/dL — ABNORMAL HIGH (ref 0–149)
VLDL CHOLESTEROL CAL: 30 mg/dL (ref 5–40)

## 2019-02-13 DIAGNOSIS — Z20828 Contact with and (suspected) exposure to other viral communicable diseases: Secondary | ICD-10-CM | POA: Diagnosis not present

## 2019-10-05 ENCOUNTER — Encounter: Payer: Self-pay | Admitting: Family Medicine

## 2019-10-05 ENCOUNTER — Ambulatory Visit: Payer: BC Managed Care – PPO | Admitting: Family Medicine

## 2019-10-05 ENCOUNTER — Other Ambulatory Visit: Payer: Self-pay

## 2019-10-05 VITALS — BP 120/64 | HR 81 | Ht 65.0 in | Wt 136.5 lb

## 2019-10-05 DIAGNOSIS — Z23 Encounter for immunization: Secondary | ICD-10-CM

## 2019-10-05 DIAGNOSIS — K921 Melena: Secondary | ICD-10-CM | POA: Diagnosis not present

## 2019-10-05 LAB — POCT URINALYSIS DIP (MANUAL ENTRY)
Bilirubin, UA: NEGATIVE
Blood, UA: NEGATIVE
Glucose, UA: NEGATIVE mg/dL
Ketones, POC UA: NEGATIVE mg/dL
Leukocytes, UA: NEGATIVE
Nitrite, UA: NEGATIVE
Protein Ur, POC: NEGATIVE mg/dL
Spec Grav, UA: 1.015 (ref 1.010–1.025)
Urobilinogen, UA: 0.2 E.U./dL
pH, UA: 7 (ref 5.0–8.0)

## 2019-10-05 LAB — HEMOCCULT GUIAC POC 1CARD (OFFICE): Fecal Occult Blood, POC: POSITIVE — AB

## 2019-10-05 NOTE — Progress Notes (Signed)
Subjective: Chief Complaint  Patient presents with  . Dysuria     HPI: Luke Sloan is a 49 y.o. presenting to clinic today to discuss the following:  1 Pain with Defecation 5 days ago, patient was very constipated, strained and defecated, had lots of pain in his rectum afterwards.  The following day, he noted that he defecated twice, and had bright red blood in his stool.  2 days ago, the same happened, but one of his bowel movements he had blood in the toilet before he even defecated.  Noted that he was having severe pain in his rectum and have pain with sitting down.  He states "it was like something came out and then went back in."  States that he has never had anything like this before.  States that his poop did look very small balls, but now "is normal."  Notes that pain has greatly improved and at present he is able to sit without pain.  Denies chest pain, shortness of breath, dizziness.  Father reports in 2004 in [place], his father passed from a cancer that was something in the gastric region, but he is unsure exactly where.    iPad interpretor Donia Guiles [phone] used for entirety of encounter     ROS noted in HPI. Chief complaint noted.  Other Pertinent PMH: History of elevated LDL, otherwise healthy Past Medical, Surgical, Social, and Family History Reviewed & Updated per EMR.      Social History   Tobacco Use  Smoking Status Never Smoker  Smokeless Tobacco Never Used   Smoking status noted.    Objective: BP 120/64   Pulse 81   Ht 5\' 5"  (1.651 m)   Wt 136 lb 8 oz (61.9 kg)   SpO2 98%   BMI 22.71 kg/m  Vitals and nursing notes reviewed  Physical Exam:  General: 49 y.o. male in NAD Lungs: Breathing comfortably on room air Skin: warm and dry Rectal Exam: No obvious external hemorrhoids on external anal exam, anoscopy performed.  Visualized at least 1 swollen, purple hemorrhoid in rectal vault.  Procedure was aborted secondary to pain.  FOBT  performed, positive.   Results for orders placed or performed in visit on 10/05/19 (from the past 72 hour(s))  POCT urinalysis dipstick     Status: None   Collection Time: 10/05/19  3:55 PM  Result Value Ref Range   Color, UA yellow yellow   Clarity, UA clear clear   Glucose, UA negative negative mg/dL   Bilirubin, UA negative negative   Ketones, POC UA negative negative mg/dL   Spec Grav, UA 1.015 1.010 - 1.025   Blood, UA negative negative   pH, UA 7.0 5.0 - 8.0   Protein Ur, POC negative negative mg/dL   Urobilinogen, UA 0.2 0.2 or 1.0 E.U./dL   Nitrite, UA Negative Negative   Leukocytes, UA Negative Negative  POCT occult blood stool     Status: Abnormal   Collection Time: 10/05/19  4:40 PM  Result Value Ref Range   Fecal Occult Blood, POC Positive (A) Negative   Card #1 Date 10/05/2019     Assessment/Plan:  Hematochezia History and exam consistent with hemorrhoids.  Patient advised on sitz bath, using MiraLAX to keep stools soft, refraining from straining to have a bowel movement, and using topical hemorrhoid creams for pain.  Given his family history of possible colon cancer, will also refer to GI for colonoscopy.  Patient agrees with this plan.  He  does not have any symptoms of symptomatic anemia, therefore will not perform CBC today.  ED precautions discussed with patient including chest pain, shortness of breath, dizziness, extreme amounts of blood loss.     PATIENT EDUCATION PROVIDED: See AVS    Diagnosis and plan along with any newly prescribed medication(s) were discussed in detail with this patient today. The patient verbalized understanding and agreed with the plan. Patient advised if symptoms worsen return to clinic or ER.   Health maintenance: Patient received flu shot today   Orders Placed This Encounter  Procedures  . Flu Vaccine QUAD 36+ mos IM  . Ambulatory referral to Gastroenterology    Referral Priority:   Routine    Referral Type:   Consultation     Referral Reason:   Specialty Services Required    Number of Visits Requested:   1  . POCT urinalysis dipstick  . POCT occult blood stool    No orders of the defined types were placed in this encounter.    Arizona Constable, DO 10/05/2019, 5:15 PM PGY-2 [place]

## 2019-10-05 NOTE — Assessment & Plan Note (Signed)
History and exam consistent with hemorrhoids.  Patient advised on sitz bath, using MiraLAX to keep stools soft, refraining from straining to have a bowel movement, and using topical hemorrhoid creams for pain.  Given his family history of possible colon cancer, will also refer to GI for colonoscopy.  Patient agrees with this plan.  He does not have any symptoms of symptomatic anemia, therefore will not perform CBC today.  ED precautions discussed with patient including chest pain, shortness of breath, dizziness, extreme amounts of blood loss.

## 2019-10-05 NOTE — Patient Instructions (Addendum)
Thank you for coming to see me today. It was a pleasure. Today we talked about:   Your blood in your stool:  You have hemorrhoids.  Take miralax as needed to keep your stools soft.  You can use hemorrhoid cream from the pharmacy, a brand name that is common is Preparation H.    I have placed a referral to Gastroenterologist for a colonoscopy.  If you do not hear from them in the next 2 weeks, please give [place] a call.  Please follow-up with your PCP in 2 months or sooner as needed.  If you have any questions or concerns, please do not hesitate to call the office at [phone].  Best,   Arizona Constable, DO   Hemorroides Hemorrhoids Las hemorroides son venas inflamadas adentro o alrededor del recto o del ano. Hay dos tipos de hemorroides:  Hemorroides internas. Se forman en las venas del interior del recto. Pueden abultarse hacia afuera, irritarse y doler.  Hemorroides externas. Se producen en las venas externas del ano y pueden sentirse como un bulto o zona hinchada, dura y dolorosa cerca del ano. [place] hemorroides no causan problemas graves y se Engineer, petroleum con tratamientos caseros [employer] cambios en la dieta y el estilo de vida. Si los tratamientos caseros no ayudan con los sntomas, se pueden Optometrist procedimientos para reducir o extirpar las hemorroides. Cules son las causas? La causa de esta afeccin es el aumento de la presin en la zona anal. Esta presin puede ser causada por distintos factores, por ejemplo:  Estreimiento.  Hacer un gran esfuerzo para defecar.  Diarrea.  Embarazo.  Obesidad.  Estar sentado durante largos perodos de [place].  Levantar objetos pesados u otras actividades que impliquen esfuerzo.  Sexo anal.  Andar en bicicleta por un largo perodo de tiempo. Cules son los signos o los sntomas? Los sntomas de esta afeccin incluyen los siguientes:  Social research officer, government.  Picazn o irritacin anal.  Sangrado rectal.  Prdida de  materia fecal (heces).  Inflamacin anal.  Uno o ms bultos alrededor del ano. Cmo se diagnostica? Esta afeccin se diagnostica frecuentemente a travs de un examen visual. Posiblemente le realicen otros tipos de pruebas o estudios, como los siguientes:  Un examen que implica palpar el rea rectal con la mano enguantada (examen rectal digital).  Un examen del canal anal que se realiza utilizando un pequeo tubo (anoscopio).  Anlisis de sangre si ha perdido [place] cantidad significativa de [place].  Una prueba que consiste en la observacin del interior del colon utilizando un tubo flexible con una cmara en el extremo (sigmoidoscopia o colonoscopa). Cmo se trata? Esta afeccin generalmente se puede tratar en el hogar. Sin embargo, se pueden TEFL teacher procedimientos si los cambios en la dieta, en el estilo de vida y otros tratamientos caseros no [employer] sntomas. Estos procedimientos pueden ayudar a reducir o [place] hemorroides completamente. Algunos de estos procedimientos son quirrgicos y otros no. Algunos de los procedimientos ms frecuentes son los siguientes:  Ligadura con Forensic psychologist. Las bandas elsticas se colocan en la base de las hemorroides para interrumpir su irrigacin de [place].  Escleroterapia. Se inyecta un medicamento en las hemorroides para reducir su tamao.  Coagulacin con luz infrarroja. Se utiliza un tipo de energa lumnica para eliminar las hemorroides.  Hemorroidectoma. Las hemorroides se extirpan con [place] y las venas que las [place] se IT consultant.  Hemorroidopexia con grapas. El cirujano engrapa la base de las hemorroides a la pared del recto.  Siga estas indicaciones en su casa: Comida y bebida   Consuma alimentos con alto contenido de [place], como cereales integrales, porotos, frutos secos, frutas y verduras.  Pregntele a su mdico acerca de tomar productos con fibra aadida en ellos (complementos de fibra).  Disminuya la cantidad de  grasa de la dieta. Esto se puede lograr consumiendo productos lcteos con bajo contenido de grasas, ingiriendo menor cantidad de carnes rojas y evitando los alimentos procesados.  Beba suficiente lquido como para Theatre manager la orina de color amarillo plido. Control del dolor y la hinchazn   Tome baos de asiento tibios durante 20 minutos, 3 o 4 veces por da para Glass blower/designer y las [place]. Puede hacer esto en una baera o usar un dispositivo porttil para bao de asiento que se coloca sobre el inodoro.  Si se lo indican, aplique hielo en la zona afectada. Usar compresas de [employer] baos de asiento puede ser [place]. ? Ponga el hielo en una bolsa plstica. ? Coloque una [employer] piel y Therapist, nutritional. ? Coloque el hielo durante 58minutos, 2 a 3veces por da. Indicaciones generales  [employer] de venta libre y los recetados solamente como se lo haya indicado el mdico.  Aplquese los medicamentos, cremas o supositorios como se lo hayan indicado.  Haga ejercicio con regularidad. Consulte al mdico qu cantidad y qu tipo de ejercicio es mejor para usted. En general, debe realizar al menos 70minutos de ejercicio moderado la [employer] de la semana (150 minutos cada semana). Esto puede incluir [employer], andar en bicicleta o practicar yoga.  Vaya al bao cuando sienta la necesidad de defecar. No espere.  Evite hacer fuerza en las deposiciones.  Mantenga la zona anal limpia y seca. Use papel higinico hmedo o toallitas humedecidas despus de las deposiciones.  No pase mucho tiempo sentado en el inodoro. Esto aumenta la afluencia de sangre y Conservation officer, historic buildings.  Concurra a todas las visitas de seguimiento como se lo haya indicado el mdico. Esto es importante. Comunquese con un mdico si tiene:  Aumento del dolor y la hinchazn que no puede controlar con medicamentos o Clinical research associate.  No puede defecar o lo hace con dificultad.  Dolor o tiene  inflamacin fuera de la zona de las hemorroides. Solicite ayuda inmediatamente si tiene:  Hemorragia descontrolada en el recto. Resumen  Las hemorroides son venas inflamadas adentro o alrededor del recto o del ano.  La mayora de las hemorroides se pueden controlar con tratamientos caseros como cambios en la dieta y el estilo de vida.  Tomar baos de asiento con agua tibia puede ayudar a Best boy y las [place].  En los casos graves, se pueden realizar procedimientos o Ardelia Mems ciruga para reducir o [place] hemorroides. Esta informacin no tiene Marine scientist el consejo del mdico. Asegrese de hacerle al mdico cualquier pregunta que tenga. Document Revised: 03/06/2018 Document Reviewed: 03/06/2018 Elsevier Patient Education  [place].

## 2019-10-19 ENCOUNTER — Other Ambulatory Visit: Payer: BC Managed Care – PPO

## 2019-10-19 ENCOUNTER — Ambulatory Visit: Payer: BC Managed Care – PPO | Admitting: Gastroenterology

## 2019-10-19 ENCOUNTER — Encounter: Payer: Self-pay | Admitting: Gastroenterology

## 2019-10-19 VITALS — BP 130/70 | HR 80 | Temp 98.1°F | Ht 65.0 in | Wt 135.0 lb

## 2019-10-19 DIAGNOSIS — Z01818 Encounter for other preprocedural examination: Secondary | ICD-10-CM | POA: Diagnosis not present

## 2019-10-19 DIAGNOSIS — Z8619 Personal history of other infectious and parasitic diseases: Secondary | ICD-10-CM

## 2019-10-19 DIAGNOSIS — K649 Unspecified hemorrhoids: Secondary | ICD-10-CM | POA: Diagnosis not present

## 2019-10-19 DIAGNOSIS — K625 Hemorrhage of anus and rectum: Secondary | ICD-10-CM | POA: Diagnosis not present

## 2019-10-19 MED ORDER — SUPREP BOWEL PREP KIT 17.5-3.13-1.6 GM/177ML PO SOLN: ORAL | 0 refills | Status: DC

## 2019-10-19 NOTE — Patient Instructions (Addendum)
If you are age 47 or older, your body mass index should be between 23-30. Your Body mass index is 22.47 kg/m. If this is out of the aforementioned range listed, please consider follow up with your Primary Care Provider.  If you are age 66 or younger, your body mass index should be between 19-25. Your Body mass index is 22.47 kg/m. If this is out of the aformentioned range listed, please consider follow up with your Primary Care Provider.   You have been scheduled for a colonoscopy. Please follow written instructions given to you at your visit today.  Please pick up your prep supplies at the pharmacy within the next 1-3 days. If you use inhalers (even only as needed), please bring them with you on the day of your procedure.  Please go to the lab in the basement of our building to have lab work done as you leave today. Hit "B" for basement when you get on the elevator.  When the doors open the lab is on your left.  We will call you with the results. Thank you.  Thank you for entrusting me with your care and for choosing [facility], Dr. [place] Cellar

## 2019-10-19 NOTE — Progress Notes (Signed)
HPI :  49 year old male with history of H. pylori, referred here by Sherene Sires DO for rectal bleeding and suspected hemorrhoids.  The patient is Spanish-speaking, Optometrist provided history. Around January 21 he states he was quite constipated, past stool and had fair amount of blood noted in it, and had "tissue bulging" from his rectum which was painful.  He states he then had another bowel movement past fair volume of bright red blood that was also painful.  He was seen by his primary care and started on MiraLAX and then Preparation H as well.  He states that this has significantly helped and is doing much better in this regard.  He has stopped the MiraLAX and is using a daily fiber supplement to keep his stools soft and he has not had any straining since then.  He had stool that was also sent for occult blood that was positive on January 25.  He denies a history of rectal bleeding outside of these 2 episodes.  He is generally been doing pretty well and is healthy at baseline.  He states he has had H. pylori in 2003 when he had some upper tract symptoms, was treated for this but denies any eradication testing.  He states his father had gastric cancer diagnosed in his late 76s to 62s.  He has never had a prior colonoscopy.  Otherwise denies symptoms of hemorrhoids at baseline.  No weight loss.   Past Medical History:  Diagnosis Date  . Helicobacter pylori infection 10/2001   treated     Past Surgical History:  Procedure Laterality Date  . laceration head  1999   Family History  Problem Relation Age of Onset  . Cancer Father        gastric   Social History   Tobacco Use  . Smoking status: Never Smoker  . Smokeless tobacco: Never Used  Substance Use Topics  . Alcohol use: Yes    Alcohol/week: 2.0 standard drinks    Types: 2 Standard drinks or equivalent per week    Comment: sometimes  . Drug use: No   Current Outpatient Medications  Medication Sig Dispense Refill  .  hydrocortisone cream (PREPARATION H) 1 % Apply 1 application topically 2 (two) times daily.     No current facility-administered medications for this visit.   No Known Allergies   Review of Systems: All systems reviewed and negative except where noted in HPI.   Lab Results  Component Value Date   WBC 5.8 02/14/2018   HGB 13.8 02/14/2018   HCT 40.6 02/14/2018   MCV 89 02/14/2018   PLT 196 02/14/2018      Physical Exam: BP 130/70   Pulse 80   Temp 98.1 F (36.7 C)   Ht 5\' 5"  (1.651 m)   Wt 135 lb (61.2 kg)   BMI 22.47 kg/m  Constitutional: Pleasant,well-developed, male in no acute distress. HEENT: Normocephalic and atraumatic. Conjunctivae are normal. No scleral icterus. Neck supple.  Cardiovascular: Normal rate, regular rhythm.  Pulmonary/chest: Effort normal and breath sounds normal. No wheezing, rales or rhonchi. Abdominal: Soft, nondistended, nontender. There are no masses palpable. Extremities: no edema Lymphadenopathy: No cervical adenopathy noted. Neurological: Alert and oriented to person place and time. Skin: Skin is warm and dry. No rashes noted. Psychiatric: Normal mood and affect. Behavior is normal.   ASSESSMENT AND PLAN: 49 year old male here for new patient assessment of the following:  Rectal bleeding / hemorrhoids - based on history he very likely has hemorrhoidal  bleeding in the setting of constipation which has been treated and he is doing much better since that time.  Anoscopy performed by his primary care suggested hemorrhoids.  However, given his age and the symptoms, colonoscopy is recommended to confirm hemorrhoids as the cause and ensure no other pathology, and to perform his screening as he is due for colon cancer screening at this time.  I discussed colonoscopy, risks and benefits of the exam and anesthesia and he want to proceed.  Further recommendations pending the results.  He will continue daily fiber supplementation in the interim to keep  stools soft and prevent constipation.  History of H. Pylori - he never had eradication testing from what he reports today.  His father had gastric cancer diagnosed late 54s or so.  Recommend H. pylori stool antigen to confirm eradication of H. pylori given his history.  He will submit this at his convenience. He otherwise has no upper tract symptoms and asymptomatic at this time.  Louise Cellar, MD [facility]  CC: Sherene Sires, DO

## 2019-10-20 ENCOUNTER — Other Ambulatory Visit: Payer: BC Managed Care – PPO

## 2019-10-20 DIAGNOSIS — Z8619 Personal history of other infectious and parasitic diseases: Secondary | ICD-10-CM | POA: Diagnosis not present

## 2019-10-21 LAB — HELICOBACTER PYLORI  SPECIAL ANTIGEN
MICRO NUMBER:: 10132534
SPECIMEN QUALITY: ADEQUATE

## 2019-10-26 ENCOUNTER — Other Ambulatory Visit: Payer: Self-pay | Admitting: Gastroenterology

## 2019-10-26 ENCOUNTER — Ambulatory Visit (INDEPENDENT_AMBULATORY_CARE_PROVIDER_SITE_OTHER): Payer: BC Managed Care – PPO

## 2019-10-26 DIAGNOSIS — Z1159 Encounter for screening for other viral diseases: Secondary | ICD-10-CM

## 2019-10-27 LAB — SARS CORONAVIRUS 2 (TAT 6-24 HRS): SARS Coronavirus 2: NEGATIVE

## 2019-10-28 ENCOUNTER — Other Ambulatory Visit: Payer: Self-pay

## 2019-10-28 ENCOUNTER — Encounter: Payer: Self-pay | Admitting: Gastroenterology

## 2019-10-28 ENCOUNTER — Ambulatory Visit (AMBULATORY_SURGERY_CENTER): Payer: BC Managed Care – PPO | Admitting: Gastroenterology

## 2019-10-28 VITALS — BP 109/75 | HR 74 | Temp 97.5°F | Resp 16 | Ht 65.0 in | Wt 135.0 lb

## 2019-10-28 DIAGNOSIS — D122 Benign neoplasm of ascending colon: Secondary | ICD-10-CM

## 2019-10-28 DIAGNOSIS — K625 Hemorrhage of anus and rectum: Secondary | ICD-10-CM

## 2019-10-28 MED ORDER — SODIUM CHLORIDE 0.9 % IV SOLN: 500.0000 mL | INTRAVENOUS | Status: DC

## 2019-10-28 NOTE — Progress Notes (Addendum)
Temp by Ledora Bottcher by [employer]

## 2019-10-28 NOTE — Progress Notes (Signed)
Called to room to assist during endoscopic procedure.  Patient ID and intended procedure confirmed with present staff. Received instructions for my participation in the procedure from the performing physician.  

## 2019-10-28 NOTE — Progress Notes (Signed)
Report given to PACU, vss 

## 2019-10-28 NOTE — Patient Instructions (Signed)
Please read handouts provided. Continue present medications. Await pathology results.        YOU HAD AN ENDOSCOPIC PROCEDURE TODAY AT THE [facility]:   Refer to the procedure report that was given to you for any specific questions about what was found during the examination.  If the procedure report does not answer your questions, please call your gastroenterologist to clarify.  If you requested that your care partner not be given the details of your procedure findings, then the procedure report has been included in a sealed envelope for you to review at your convenience later.  YOU SHOULD EXPECT: Some feelings of bloating in the abdomen. Passage of more gas than usual.  Walking can help get rid of the air that was put into your GI tract during the procedure and reduce the bloating. If you had a lower endoscopy (such as a colonoscopy or flexible sigmoidoscopy) you may notice spotting of blood in your stool or on the toilet paper. If you underwent a bowel prep for your procedure, you may not have a normal bowel movement for a few days.  Please Note:  You might notice some irritation and congestion in your nose or some drainage.  This is from the oxygen used during your procedure.  There is no need for concern and it should clear up in a day or so.  SYMPTOMS TO REPORT IMMEDIATELY:   Following lower endoscopy (colonoscopy or flexible sigmoidoscopy):  Excessive amounts of blood in the stool  Significant tenderness or worsening of abdominal pains  Swelling of the abdomen that is new, acute  Fever of 100F or higher    For urgent or emergent issues, a gastroenterologist can be reached at any hour by calling [phone].   DIET:  We do recommend a small meal at first, but then you may proceed to your regular diet.  Drink plenty of fluids but you should avoid alcoholic beverages for 24 hours.  ACTIVITY:  You should plan to take it easy for the rest of today and you should NOT  DRIVE or use heavy machinery until tomorrow (because of the sedation medicines used during the test).    FOLLOW UP: Our staff will call the number listed on your records 48-72 hours following your procedure to check on you and address any questions or concerns that you may have regarding the information given to you following your procedure. If we do not reach you, we will leave a message.  We will attempt to reach you two times.  During this call, we will ask if you have developed any symptoms of COVID 19. If you develop any symptoms (ie: fever, flu-like symptoms, shortness of breath, cough etc.) before then, please call [phone].  If you test positive for Covid 19 in the 2 weeks post procedure, please call and report this information to us.    If any biopsies were taken you will be contacted by phone or by letter within the next 1-3 weeks.  Please call us at [phone] if you have not heard about the biopsies in 3 weeks.    SIGNATURES/CONFIDENTIALITY: You and/or your care partner have signed paperwork which will be entered into your electronic medical record.  These signatures attest to the fact that that the information above on your After Visit Summary has been reviewed and is understood.  Full responsibility of the confidentiality of this discharge information lies with you and/or your care-partner. 

## 2019-10-28 NOTE — Op Note (Signed)
[name] Procedure Date: 10/28/2019 8:02 AM MRN: [id number] Endoscopist: Remo Lipps P. Havery Moros , MD Age: 49 Referring MD:  Date of Birth: October 08, 1970 Gender: Male Account #: [id number] Procedure:                Colonoscopy Indications:              This is the patient's first colonoscopy, Rectal                            bleeding Medicines:                Monitored Anesthesia Care Procedure:                Pre-Anesthesia Assessment:                           - Prior to the procedure, a History and Physical                            was performed, and patient medications and                            allergies were reviewed. The patient's tolerance of                            previous anesthesia was also reviewed. The risks                            and benefits of the procedure and the sedation                            options and risks were discussed with the patient.                            All questions were answered, and informed consent                            was obtained. Prior Anticoagulants: The patient has                            taken no previous anticoagulant or antiplatelet                            agents. ASA Grade Assessment: II - A patient with                            mild systemic disease. After reviewing the risks                            and benefits, the patient was deemed in                            satisfactory condition to undergo the procedure.  After obtaining informed consent, the colonoscope                            was passed under direct vision. Throughout the                            procedure, the patient's blood pressure, pulse, and                            oxygen saturations were monitored continuously. The                            Colonoscope was introduced through the anus and                            advanced to the the terminal ileum, with                  identification of the appendiceal orifice and IC                            valve. The colonoscopy was performed without                            difficulty. The patient tolerated the procedure                            well. The quality of the bowel preparation was                            good. The terminal ileum, ileocecal valve,                            appendiceal orifice, and rectum were photographed. Scope In: 8:03:59 AM Scope Out: 8:26:25 AM Scope Withdrawal Time: 0 hours 17 minutes 46 seconds  Total Procedure Duration: 0 hours 22 minutes 26 seconds  Findings:                 The perianal and digital rectal examinations were                            normal.                           The terminal ileum appeared normal.                           A diminutive polyp was found in the ascending                            colon. The polyp was sessile. The polyp was removed                            with a cold biopsy forceps. Resection and retrieval  were complete.                           Internal hemorrhoids were found during                            retroflexion. The hemorrhoids were moderate in size.                           The colon was tortous. The exam was otherwise                            without abnormality. Complications:            No immediate complications. Estimated blood loss:                            Minimal. Estimated Blood Loss:     Estimated blood loss was minimal. Impression:               - The examined portion of the ileum was normal.                           - One diminutive polyp in the ascending colon,                            removed with a cold biopsy forceps. Resected and                            retrieved.                           - Tortous colon                           - Internal hemorrhoids.                           - The examination was otherwise normal.                            Hemorrhoids are the cause of the patient's symptoms Recommendation:           - Patient has a contact number available for                            emergencies. The signs and symptoms of potential                            delayed complications were discussed with the                            patient. Return to normal activities tomorrow.                            Written discharge instructions were provided to the  patient.                           - Resume previous diet.                           - Continue present medications.                           - Await pathology results.                           - Consideration for daily fiber supplement,                            minimize straining with bowel movements                           - If symptoms of bleeding persist over time,                            consideration for hemorrhoid banding. Follow up in                            the office as needed for this issue [name], MD 10/28/2019 8:30:34 AM This report has been signed electronically.

## 2019-10-30 ENCOUNTER — Telehealth: Payer: Self-pay

## 2019-10-30 NOTE — Telephone Encounter (Signed)
  Follow up Call-  Call back number 10/28/2019  Post procedure Call Back phone  # [phone]  Permission to leave phone message Yes  Some recent data might be hidden     Patient questions:  Do you have a fever, pain , or abdominal swelling? No. Pain Score  0 *  Have you tolerated food without any problems? Yes.    Have you been able to return to your normal activities? Yes.    Do you have any questions about your discharge instructions: Diet   No. Medications  No. Follow up visit  No.  Do you have questions or concerns about your Care? No.  Actions: * If pain score is 4 or above: No action needed, pain <4. 1. Have you developed a fever since your procedure? no  2.   Have you had an respiratory symptoms (SOB or cough) since your procedure? no  3.   Have you tested positive for COVID 19 since your procedure no  4.   Have you had any family members/close contacts diagnosed with the COVID 19 since your procedure?  no   If yes to any of these questions please route to Joylene John, RN and Alphonsa Gin, Therapist, sports.

## 2019-11-02 ENCOUNTER — Encounter: Payer: Self-pay | Admitting: Gastroenterology

## 2019-11-03 ENCOUNTER — Encounter: Payer: Self-pay | Admitting: Family Medicine

## 2019-11-03 DIAGNOSIS — Z1211 Encounter for screening for malignant neoplasm of colon: Secondary | ICD-10-CM | POA: Insufficient documentation

## 2019-11-27 ENCOUNTER — Ambulatory Visit: Payer: BC Managed Care – PPO

## 2020-05-20 ENCOUNTER — Emergency Department (HOSPITAL_BASED_OUTPATIENT_CLINIC_OR_DEPARTMENT_OTHER)
Admission: EM | Admit: 2020-05-20 | Discharge: 2020-05-20 | Disposition: A | Discharge status: Home / Self Care | Payer: BC Managed Care – PPO | Attending: Emergency Medicine | Admitting: Emergency Medicine

## 2020-05-20 ENCOUNTER — Encounter (HOSPITAL_BASED_OUTPATIENT_CLINIC_OR_DEPARTMENT_OTHER): Payer: Self-pay | Admitting: *Deleted

## 2020-05-20 ENCOUNTER — Other Ambulatory Visit: Payer: Self-pay

## 2020-05-20 DIAGNOSIS — R509 Fever, unspecified: Secondary | ICD-10-CM | POA: Insufficient documentation

## 2020-05-20 DIAGNOSIS — Z20822 Contact with and (suspected) exposure to covid-19: Secondary | ICD-10-CM | POA: Insufficient documentation

## 2020-05-20 DIAGNOSIS — R197 Diarrhea, unspecified: Secondary | ICD-10-CM | POA: Insufficient documentation

## 2020-05-20 DIAGNOSIS — R140 Abdominal distension (gaseous): Secondary | ICD-10-CM | POA: Insufficient documentation

## 2020-05-20 DIAGNOSIS — A09 Infectious gastroenteritis and colitis, unspecified: Secondary | ICD-10-CM

## 2020-05-20 LAB — SARS CORONAVIRUS 2 BY RT PCR (HOSPITAL ORDER, PERFORMED IN ~~LOC~~ HOSPITAL LAB): SARS Coronavirus 2: NEGATIVE

## 2020-05-20 MED ORDER — ACETAMINOPHEN 325 MG PO TABS
650.0000 mg | ORAL_TABLET | Freq: Once | ORAL | Status: AC
  Administered 2020-05-20: 650 mg via ORAL
  Filled 2020-05-20: qty 2

## 2020-05-20 MED ORDER — CIPROFLOXACIN HCL 500 MG PO TABS: 500.0000 mg | ORAL_TABLET | Freq: Two times a day (BID) | ORAL | 0 refills | Status: AC

## 2020-05-20 MED ORDER — CIPROFLOXACIN HCL 500 MG PO TABS
500.0000 mg | ORAL_TABLET | Freq: Once | ORAL | Status: AC
  Administered 2020-05-20: 500 mg via ORAL
  Filled 2020-05-20: qty 1

## 2020-05-20 NOTE — ED Provider Notes (Signed)
[place] EMERGENCY DEPARTMENT Provider Note   CSN: [id number] Arrival date & time: 05/20/20  1544     History No chief complaint on file.   Luke Sloan is a 49 y.o. male.  Patient is a 49 year old male with a history of H. pylori infection, intermittent abdominal pain but otherwise fairly healthy who presents today with 4 days of persistent abdominal distention, diarrhea and fever.  He reports Tuesdays when his symptoms started.  It was one episode of vomiting and 10 episodes of diarrhea and abdominal bloating.  Then he had between 7 and 10 episodes of diarrhea the following 2 days with persistent fever as high as 102.  Today he has had only one episode of diarrhea and feels that his abdomen is not quite as distended or painful.  He is still had low-grade fever today of 100.5.  Patient denies any blood in his stool.  He reports that he recently got back from a trip to [place].  When he was there he ate the food and drink the water and was not careful about what he ate.  His symptoms started the day he got off the plane.  He denies any cough, congestion or shortness of breath.  The history is provided by the patient.       Past Medical History:  Diagnosis Date   Helicobacter pylori infection 10/2001   treated    Patient Active Problem List   Diagnosis Date Noted   Colon cancer screening 11/03/2019   Hematochezia 10/05/2019   Knee injury, right, initial encounter 07/16/2016   Tension-type headache 02/03/2016   RUQ abdominal pain 06/07/2014   Elevated LDL cholesterol level 06/07/2014   Well adult exam 06/09/2012   ONYCHOMYCOSIS, TOENAILS 11/24/2007    Past Surgical History:  Procedure Laterality Date   laceration head  1999       Family History  Problem Relation Age of Onset   Cancer Father        gastric    Social History   Tobacco Use   Smoking status: Never Smoker   Smokeless tobacco: Never Used  Vaping Use   Vaping  Use: Never used  Substance Use Topics   Alcohol use: Yes    Alcohol/week: 2.0 standard drinks    Types: 2 Standard drinks or equivalent per week    Comment: sometimes   Drug use: No    Home Medications Prior to Admission medications   Medication Sig Start Date End Date Taking? Authorizing Provider  hydrocortisone cream (PREPARATION H) 1 % Apply 1 application topically 2 (two) times daily.    [provider]    Allergies    Patient has no known allergies.  Review of Systems   Review of Systems  All other systems reviewed and are negative.   Physical Exam Updated Vital Signs BP 123/76 (BP Location: Right Arm)    Pulse 83    Temp (!) 100.5 F (38.1 C) (Oral)    Resp 20    Ht 5\' 5"  (1.651 m)    Wt 47.6 kg    SpO2 100%    BMI 17.47 kg/m   Physical Exam Vitals and nursing note reviewed.  Constitutional:      General: He is not in acute distress.    Appearance: Normal appearance. He is well-developed and normal weight.  HENT:     Head: Normocephalic and atraumatic.  Eyes:     Conjunctiva/sclera: Conjunctivae normal.     Pupils: Pupils are equal,  round, and reactive to light.  Cardiovascular:     Rate and Rhythm: Normal rate and regular rhythm.     Heart sounds: No murmur heard.   Pulmonary:     Effort: Pulmonary effort is normal. No respiratory distress.     Breath sounds: Normal breath sounds. No wheezing or rales.  Abdominal:     General: There is no distension.     Palpations: Abdomen is soft.     Tenderness: There is abdominal tenderness. There is no right CVA tenderness, left CVA tenderness, guarding or rebound.     Comments: Mild periumbilical tenderness with mild distention.  No rebound or guarding  Musculoskeletal:        General: No tenderness. Normal range of motion.     Cervical back: Normal range of motion and neck supple.  Skin:    General: Skin is warm and dry.     Findings: No erythema or rash.  Neurological:     Mental Status: He is alert  and oriented to person, place, and time.  Psychiatric:        Mood and Affect: Mood normal.        Behavior: Behavior normal.        Thought Content: Thought content normal.     ED Results / Procedures / Treatments   Labs (all labs ordered are listed, but only abnormal results are displayed) Labs Reviewed  SARS CORONAVIRUS 2 BY RT PCR ([facility])  GASTROINTESTINAL PANEL BY PCR, STOOL (REPLACES STOOL CULTURE)  OVA + PARASITE EXAM    EKG None  Radiology No results found.  Procedures Procedures (including critical care time)  Medications Ordered in ED Medications  ciprofloxacin (CIPRO) tablet 500 mg (has no administration in time range)    ED Course  I have reviewed the triage vital signs and the nursing notes.  Pertinent labs & imaging results that were available during my care of the patient were reviewed by me and considered in my medical decision making (see chart for details).    MDM Rules/Calculators/A&P                          Patient is a well-appearing 49 year old presenting with abdominal pain, fever and diarrhea.  This was in the setting of recent travel to [place].  Patient denies any blood in his stool.  Suspect foodborne illness due to his recent trip.  He has no abdominal findings concerning for peritonitis.  Low suspicion for diverticulitis or appendicitis.  Patient has not recently been on antibiotics.  Patient did give a stool sample here or ova and parasite as well as GI pathogen panel was sent.  Will treat with Cipro for the next 3 days.   Final Clinical Impression(s) / ED Diagnoses Final diagnoses:  Diarrhea of infectious origin    Rx / DC Orders ED Discharge Orders         Ordered    ciprofloxacin (CIPRO) 500 MG tablet  Every 12 hours        05/20/20 2058           Blanchie Dessert, MD 05/20/20 [phone]

## 2020-05-20 NOTE — ED Triage Notes (Signed)
Intermittent fever for 3 days, vomited x 1 abdominal pain and diarrhea.

## 2020-05-24 ENCOUNTER — Telehealth (HOSPITAL_BASED_OUTPATIENT_CLINIC_OR_DEPARTMENT_OTHER): Payer: Self-pay

## 2020-05-24 LAB — GASTROINTESTINAL PANEL BY PCR, STOOL (REPLACES STOOL CULTURE)
Adenovirus F40/41: NOT DETECTED
Astrovirus: NOT DETECTED
Campylobacter species: DETECTED — AB
Cryptosporidium: NOT DETECTED
Cyclospora cayetanensis: NOT DETECTED
Entamoeba histolytica: NOT DETECTED
Enteroaggregative E coli (EAEC): NOT DETECTED
Enteropathogenic E coli (EPEC): NOT DETECTED
Enterotoxigenic E coli (ETEC): NOT DETECTED
Giardia lamblia: NOT DETECTED
Norovirus GI/GII: DETECTED — AB
Plesimonas shigelloides: NOT DETECTED
Rotavirus A: NOT DETECTED
Salmonella species: NOT DETECTED
Sapovirus (I, II, IV, and V): NOT DETECTED
Shiga like toxin producing E coli (STEC): NOT DETECTED
Shigella/Enteroinvasive E coli (EIEC): NOT DETECTED
Vibrio cholerae: NOT DETECTED
Vibrio species: NOT DETECTED
Yersinia enterocolitica: NOT DETECTED

## 2020-05-24 NOTE — Telephone Encounter (Signed)
After speaking with Pfeiffer MD about pt positive Norovirus, and positive Campylobacter, MD advises to call pt to follow up. This RN spoke with pt using interpreter line. Pt reports he has been taking antibiotics as ordered with no NVD and is able to tolerate food and fluids. Pt will follow up back at ED if symptoms worsen.

## 2020-05-26 LAB — OVA + PARASITE EXAM

## 2020-05-26 LAB — O&P RESULT

## 2021-02-07 ENCOUNTER — Ambulatory Visit: Payer: BC Managed Care – PPO | Admitting: Medical

## 2021-04-14 ENCOUNTER — Ambulatory Visit: Payer: BC Managed Care – PPO | Admitting: Medical

## 2021-04-14 ENCOUNTER — Other Ambulatory Visit: Payer: Self-pay

## 2021-04-14 ENCOUNTER — Ambulatory Visit (HOSPITAL_BASED_OUTPATIENT_CLINIC_OR_DEPARTMENT_OTHER)
Admission: RE | Admit: 2021-04-14 | Discharge: 2021-04-14 | Disposition: A | Discharge status: Home / Self Care | Payer: BC Managed Care – PPO | Source: Ambulatory Visit | Attending: Medical | Admitting: Medical

## 2021-04-14 VITALS — BP 125/66 | HR 72 | Resp 18 | Ht 65.0 in | Wt 139.8 lb

## 2021-04-14 DIAGNOSIS — Z125 Encounter for screening for malignant neoplasm of prostate: Secondary | ICD-10-CM

## 2021-04-14 DIAGNOSIS — R059 Cough, unspecified: Secondary | ICD-10-CM

## 2021-04-14 DIAGNOSIS — Z0000 Encounter for general adult medical examination without abnormal findings: Secondary | ICD-10-CM

## 2021-04-14 NOTE — Patient Instructions (Signed)
For you wellness exam today I have ordered cbc, cmp andlipid panel. Future lab when fasting for 8 hours.  Psa lab placed as well.  Vaccine appear up to date.  Recommend exercise and healthy diet.  We will let you know lab results as they come in.  Follow up date appointment will be determined after lab review.    For hx of cough/clearing throat random for 5 years want to start work up with cxr. After review xray. Might recommend antistamine to assess if possible allergy/pnd.

## 2021-04-14 NOTE — Progress Notes (Signed)
Subjective:    Patient ID: Luke Sloan, male    DOB: [birth year], 50 y.o.   MRN: [id number]  HPI Pt here for first time.  Pt works Teacher, music for [employer]. Pt walks 3 days a week. Eats healthy. Non smoker. 3 beers about every 2 months. Drinks tea but not sweet.   Pt had colonoscopy in 2021.  Findings: - The perianal and digital rectal examinations were normal. - The terminal ileum appeared normal. - A diminutive polyp was found in the ascending colon. The polyp was sessile. The polyp was removed with a cold biopsy forceps. Resection and retrieval were complete. - Internal hemorrhoids were found during retroflexion. The hemorrhoids were moderate in size. - The colon was tortous. The exam was otherwise without abnormality.  Told to repeat in 5 years.    2 years since had cpe/wellness. Pt ate subway today.  States occasional dry cough late in the day. Describes more clearing throat.(Present for 5 years or more). No reflux, no allegy symptoms.      Review of Systems  Constitutional:  Negative for chills, fatigue and fever.  HENT:  Negative for congestion, drooling and ear pain.   Respiratory:  Positive for cough. Negative for chest tightness, shortness of breath and wheezing.        See hpi.  Cardiovascular:  Negative for chest pain and palpitations.  Gastrointestinal:  Negative for abdominal pain.  Genitourinary:  Negative for dysuria and frequency.  Musculoskeletal:  Negative for back pain.  Skin:  Negative for rash.  Neurological:  Negative for dizziness, seizures and headaches.  Hematological:  Negative for adenopathy. Does not bruise/bleed easily.  Psychiatric/Behavioral:  Negative for confusion and dysphoric mood.     Past Medical History:  Diagnosis Date   Helicobacter pylori infection 10/2001   treated     Social History   Socioeconomic History   Marital status: Married    Spouse name: Not on file   Number of children: 1   Years of education: 6    Highest education level: Not on file  Occupational History    Comment: assembling filters for [employer]  Tobacco Use   Smoking status: Never   Smokeless tobacco: Never  Vaping Use   Vaping Use: Never used  Substance and Sexual Activity   Alcohol use: Yes    Alcohol/week: 2.0 standard drinks    Types: 2 Standard drinks or equivalent per week    Comment: sometimes   Drug use: No   Sexual activity: Yes    Partners: Female  Other Topics Concern   Not on file  Social History Narrative   Native of [place], came to [place]. At age 11   Roofing and construction   Lives with Luke Sloan   Boy born in 2003   Caffeine - tea, one glass a day   Social Determinants of Health   Financial Resource Strain: Not on file  Food Insecurity: Not on file  Transportation Needs: Not on file  Physical Activity: Not on file  Stress: Not on file  Social Connections: Not on file  Intimate Partner Violence: Not on file    Past Surgical History:  Procedure Laterality Date   laceration head  1999    Family History  Problem Relation Age of Onset   Cancer Father        gastric    No Known Allergies  Current Outpatient Medications on File Prior to Visit  Medication Sig Dispense Refill   hydrocortisone cream  1 % Apply 1 application topically 2 (two) times daily.     No current facility-administered medications on file prior to visit.    BP 125/66   Pulse 72   Resp 18   Ht '5\' 5"'$  (1.651 m)   Wt 139 lb 12.8 oz (63.4 kg)   SpO2 98%   BMI 23.26 kg/m       Objective:   Physical Exam   General Mental Status- Alert. General Appearance- Not in acute distress.   Skin General: Color- Normal Color. Moisture- Normal Moisture.  Neck Carotid Arteries- Normal color. Moisture- Normal Moisture. No carotid bruits. No JVD.  Chest and Lung Exam Auscultation: Breath Sounds:-Normal.  Cardiovascular Auscultation:Rythm- Regular. Murmurs & Other Heart Sounds:Auscultation of the heart reveals- No  Murmurs.  Abdomen Inspection:-Inspeection Normal. Palpation/Percussion:Note:No mass. Palpation and Percussion of the abdomen reveal- Non Tender, Non Distended + BS, no rebound or guarding.   Neurologic Cranial Nerve exam:- CN III-XII intact(No nystagmus), symmetric smile. Strength:- 5/5 equal and symmetric strength both upper and lower extremities.      Assessment & Plan:   For you wellness exam today I have ordered cbc, cmp andlipid panel. Future lab when fasting for 8 hours.  Psa lab placed as well.  Vaccine appear up to date.  Recommend exercise and healthy diet.  We will let you know lab results as they come in.  Follow up date appointment will be determined after lab review.    For hx of cough/clearing throat random for 5 years want to start work up with cxr. After review xray. Might recommend antistamine to assess if possible allergy/pnd.  Luke Pai, PA-C

## 2021-04-15 MED ORDER — LEVOCETIRIZINE DIHYDROCHLORIDE 5 MG PO TABS: 5.0000 mg | ORAL_TABLET | Freq: Every evening | ORAL | 3 refills | Status: DC

## 2021-04-15 NOTE — Addendum Note (Signed)
Addended by: Anabel Halon on: 04/15/2021 02:36 PM   Modules accepted: Orders

## 2021-04-17 ENCOUNTER — Other Ambulatory Visit: Payer: Self-pay

## 2021-04-17 ENCOUNTER — Other Ambulatory Visit (INDEPENDENT_AMBULATORY_CARE_PROVIDER_SITE_OTHER): Payer: BC Managed Care – PPO

## 2021-04-17 ENCOUNTER — Telehealth: Payer: Self-pay | Admitting: Medical

## 2021-04-17 DIAGNOSIS — Z0000 Encounter for general adult medical examination without abnormal findings: Secondary | ICD-10-CM

## 2021-04-17 DIAGNOSIS — Z125 Encounter for screening for malignant neoplasm of prostate: Secondary | ICD-10-CM

## 2021-04-17 LAB — COMPREHENSIVE METABOLIC PANEL
ALT: 17 U/L (ref 0–53)
AST: 17 U/L (ref 0–37)
Albumin: 4.4 g/dL (ref 3.5–5.2)
Alkaline Phosphatase: 62 U/L (ref 39–117)
BUN: 12 mg/dL (ref 6–23)
CO2: 31 mEq/L (ref 19–32)
Calcium: 9.5 mg/dL (ref 8.4–10.5)
Chloride: 103 mEq/L (ref 96–112)
Creatinine, Ser: 0.79 mg/dL (ref 0.40–1.50)
GFR: 105.23 mL/min (ref >60.00)
Glucose, Bld: 88 mg/dL (ref 70–99)
Potassium: 4.1 mEq/L (ref 3.5–5.1)
Sodium: 140 mEq/L (ref 135–145)
Total Bilirubin: 0.6 mg/dL (ref 0.2–1.2)
Total Protein: 7.2 g/dL (ref 6.0–8.3)

## 2021-04-17 LAB — CBC WITH DIFFERENTIAL/PLATELET
Basophils Absolute: 0 10*3/uL (ref 0.0–0.1)
Basophils Relative: 0.5 % (ref 0.0–3.0)
Eosinophils Absolute: 0.1 10*3/uL (ref 0.0–0.7)
Eosinophils Relative: 2.3 % (ref 0.0–5.0)
HCT: 41.7 % (ref 39.0–52.0)
Hemoglobin: 14 g/dL (ref 13.0–17.0)
Lymphocytes Relative: 39.9 % (ref 12.0–46.0)
Lymphs Abs: 2.5 10*3/uL (ref 0.7–4.0)
MCHC: 33.7 g/dL (ref 30.0–36.0)
MCV: 91.1 fl (ref 78.0–100.0)
Monocytes Absolute: 0.6 10*3/uL (ref 0.1–1.0)
Monocytes Relative: 8.9 % (ref 3.0–12.0)
Neutro Abs: 3 10*3/uL (ref 1.4–7.7)
Neutrophils Relative %: 48.4 % (ref 43.0–77.0)
Platelets: 178 10*3/uL (ref 150.0–400.0)
RBC: 4.57 Mil/uL (ref 4.22–5.81)
RDW: 13.4 % (ref 11.5–15.5)
WBC: 6.2 10*3/uL (ref 4.0–10.5)

## 2021-04-17 LAB — LIPID PANEL
Cholesterol: 191 mg/dL (ref 0–200)
HDL: 51.6 mg/dL (ref >39.00)
LDL Cholesterol: 113 mg/dL — ABNORMAL HIGH (ref 0–99)
NonHDL: 139.2
Total CHOL/HDL Ratio: 4
Triglycerides: 130 mg/dL (ref 0.0–149.0)
VLDL: 26 mg/dL (ref 0.0–40.0)

## 2021-04-17 LAB — PSA: PSA: 0.63 ng/mL (ref 0.10–4.00)

## 2021-04-17 NOTE — Telephone Encounter (Signed)
Called patient back and advised of results

## 2021-04-17 NOTE — Telephone Encounter (Signed)
Patient is calling back to go over his results.  [phone]

## 2021-05-01 ENCOUNTER — Telehealth: Payer: Self-pay

## 2021-05-01 NOTE — Telephone Encounter (Signed)
Patient advised of cholesterol diet

## 2021-05-01 NOTE — Telephone Encounter (Signed)
Patient called to get lab results from 8-08. Patient advsied cbc, cmet and psa within normal limits. Cholesterol panel triglycerides are now normal ldl up some.

## 2021-10-01 IMAGING — DX DG CHEST 2V
2 series · 2 of 2 positions shown · non-contrast
Comparison: None.

CLINICAL DATA: Cough, clearing throat

EXAM:
CHEST - 2 VIEW

[chest pa]
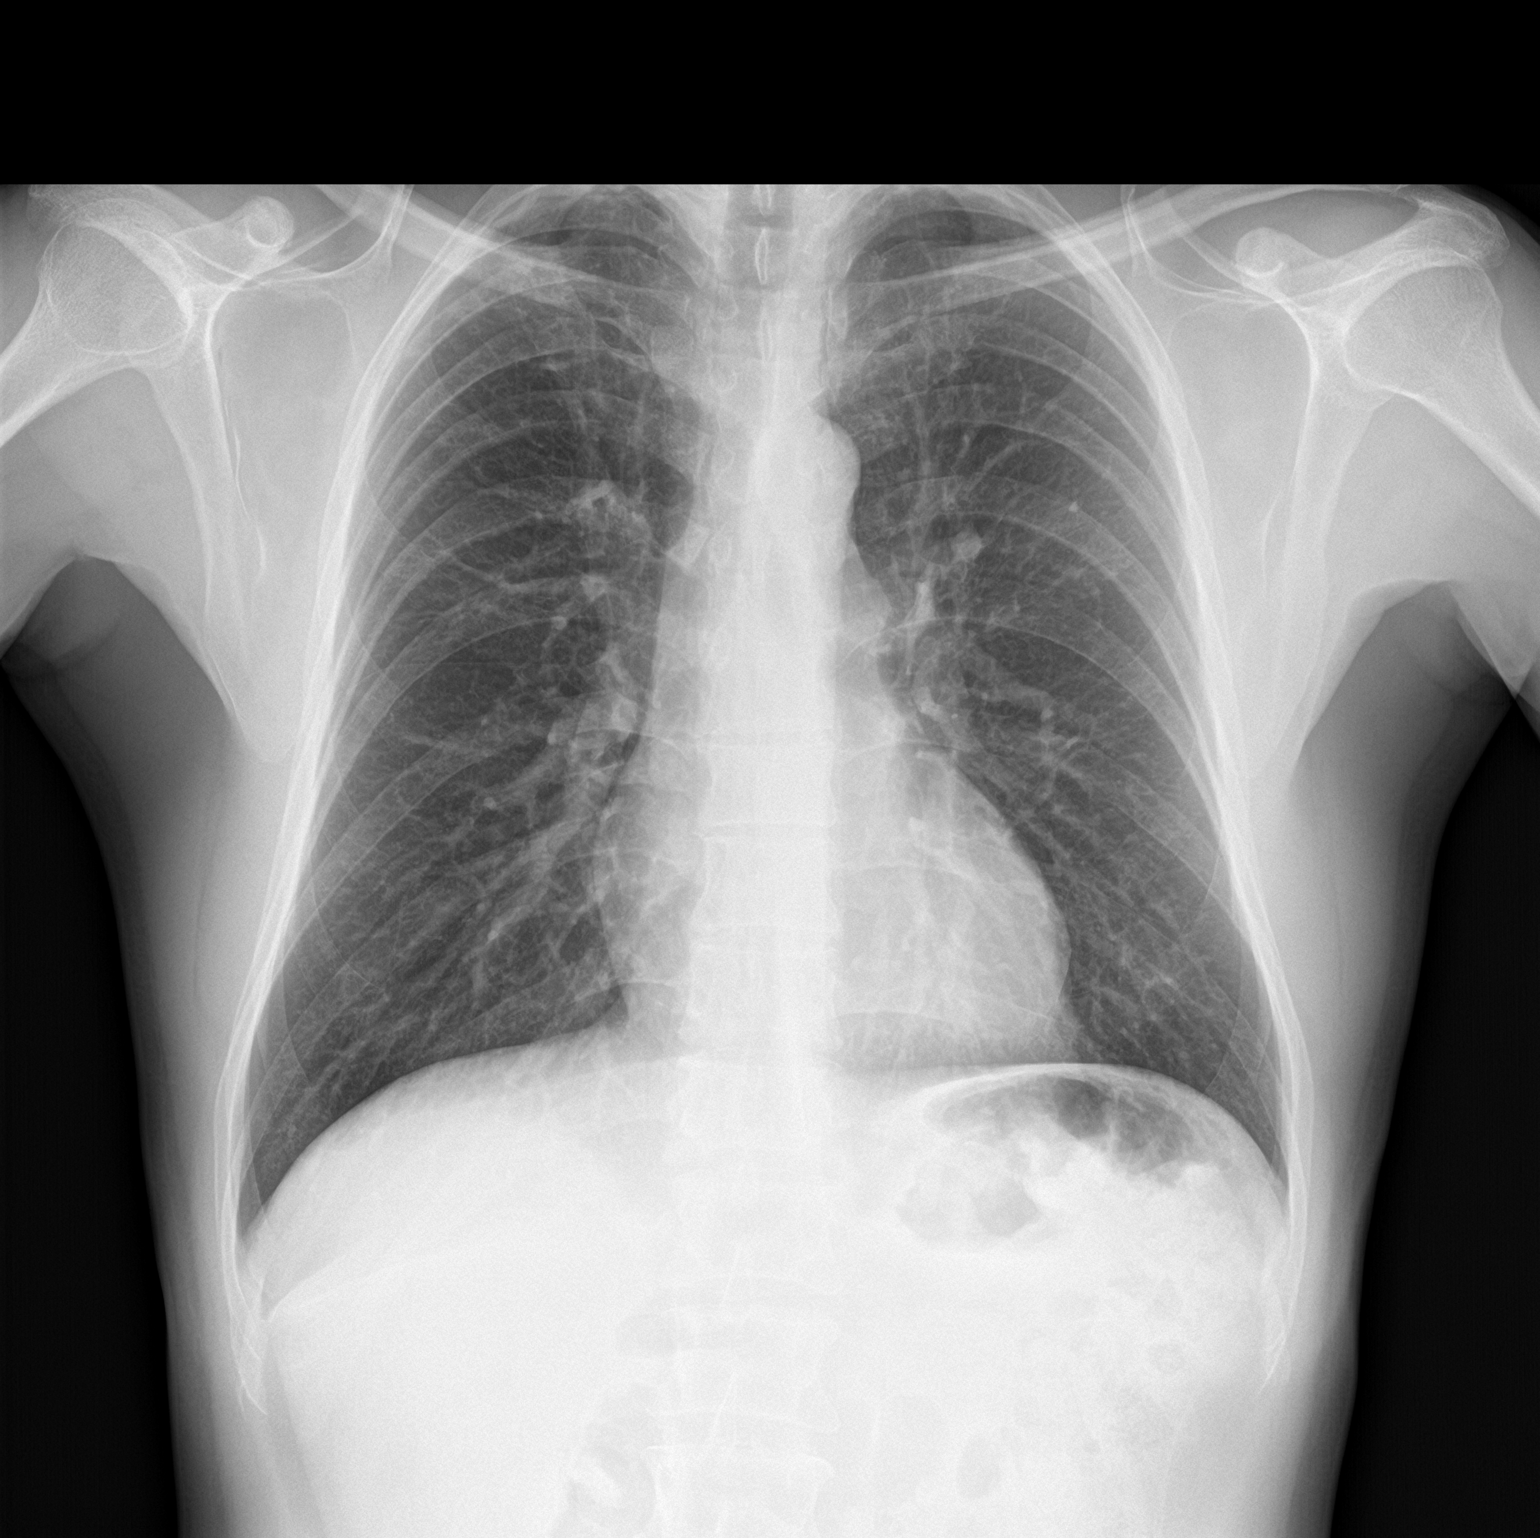

[chest lat]
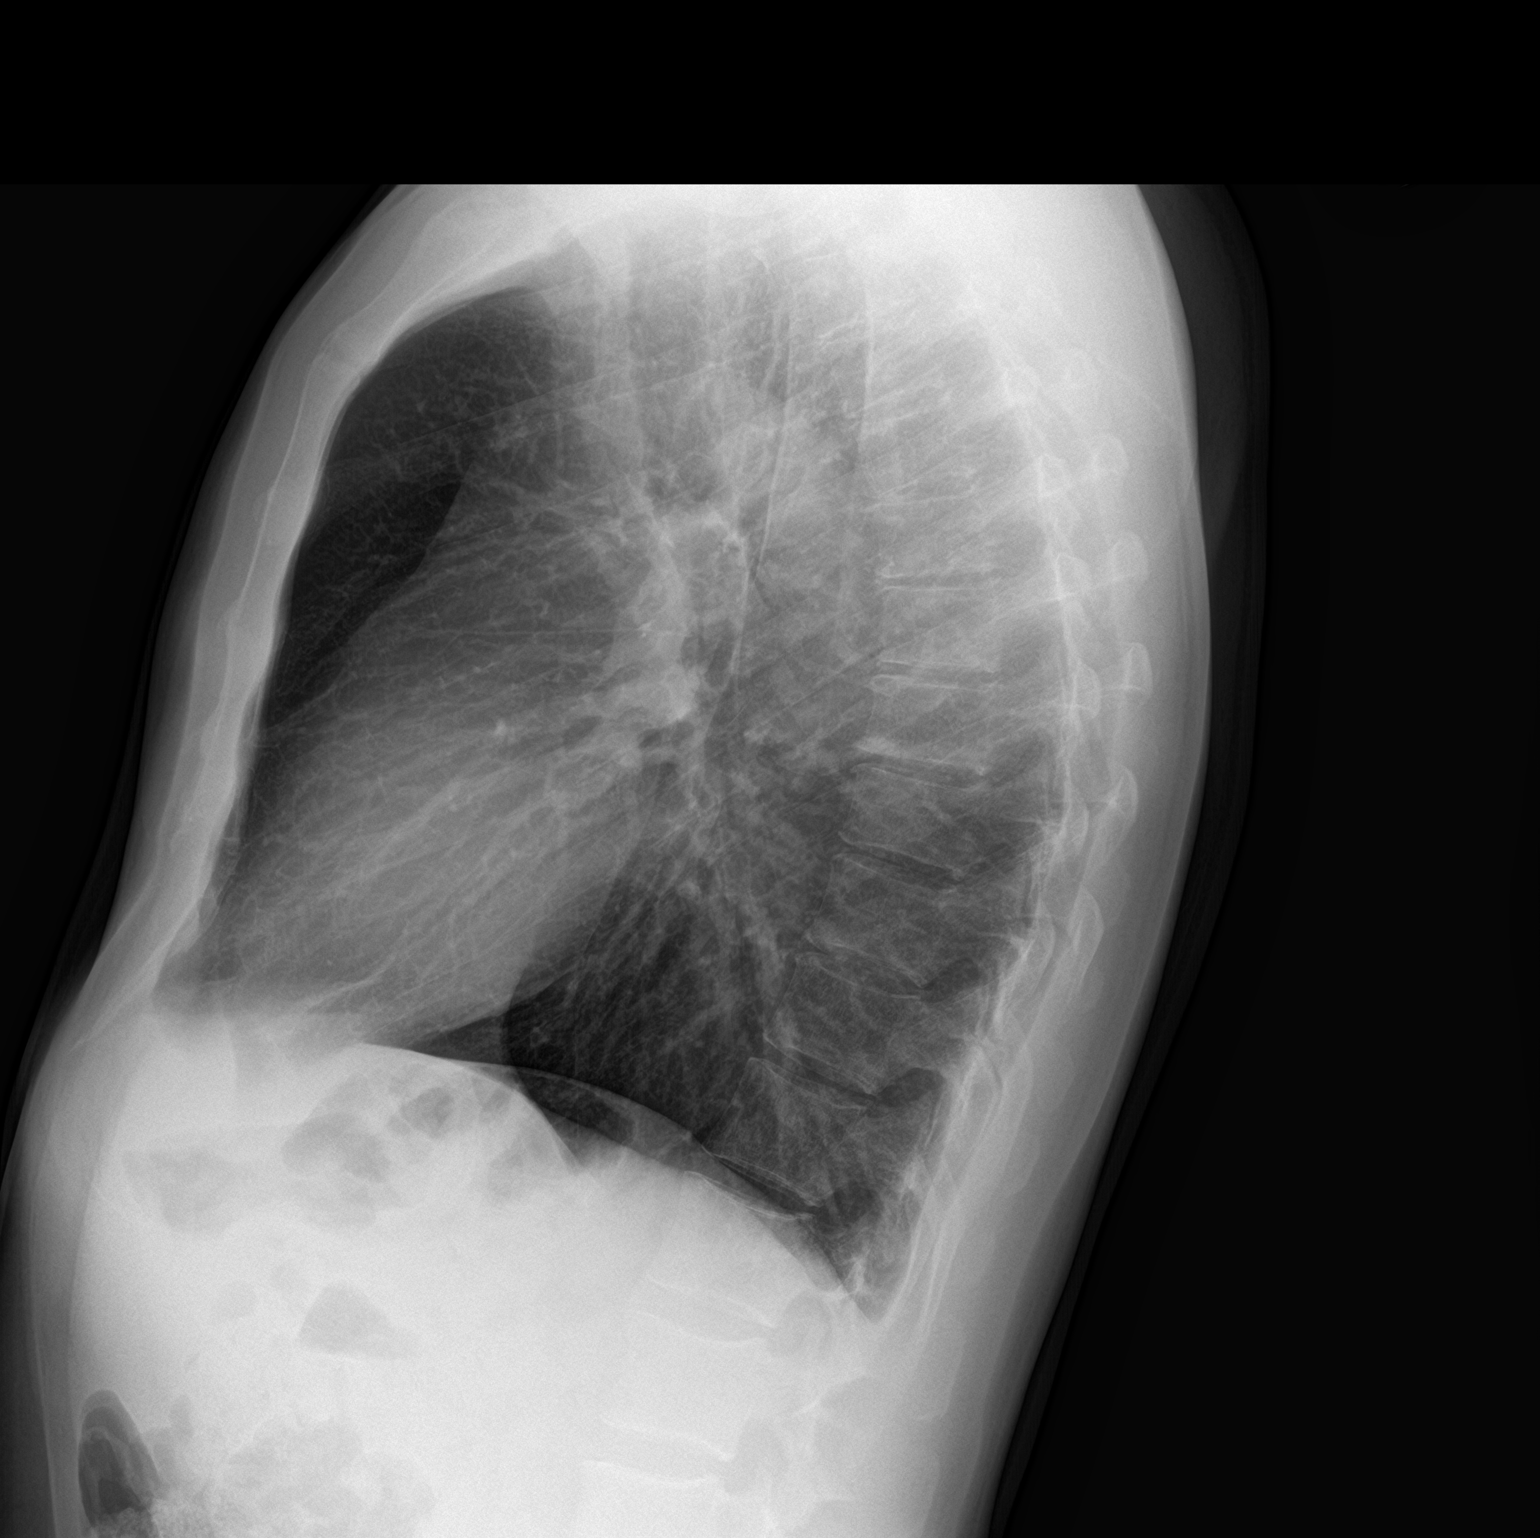

[2 of 2 positions shown; findings below may reference images not displayed]

FINDINGS: Biapical scarring. Lungs otherwise clear. Heart is normal size. No
effusions or acute bony abnormality.
IMPRESSION: Biapical scarring.

No active disease.

## 2022-01-01 ENCOUNTER — Ambulatory Visit: Payer: BC Managed Care – PPO | Admitting: Medical

## 2022-01-01 ENCOUNTER — Other Ambulatory Visit (HOSPITAL_BASED_OUTPATIENT_CLINIC_OR_DEPARTMENT_OTHER): Payer: Self-pay

## 2022-01-01 VITALS — BP 108/70 | HR 70 | Resp 18 | Ht 65.0 in | Wt 138.4 lb

## 2022-01-01 DIAGNOSIS — K645 Perianal venous thrombosis: Secondary | ICD-10-CM | POA: Diagnosis not present

## 2022-01-01 MED ORDER — HYDROCORT-PRAMOXINE (PERIANAL) 1-1 % EX FOAM
1.0000 | Freq: Three times a day (TID) | CUTANEOUS | 0 refills | Status: AC
  Filled 2022-01-01: qty 10, 13d supply, fill #0

## 2022-01-01 NOTE — Progress Notes (Signed)
? ?Subjective:  ? ? Patient ID: Luke Sloan, male    DOB: 29-Mar-1971, 51 y.o.   MRN: [id number] ? ?HPI ?Pt states Saturday had some rectal pain. Pt states when wipes will see bright red blood on toilet paper. Pt states at time will get constipation at times. Pt states has lump near rectum that hurts to sit on. Pt states some burning. No itching. ? ?Pt has hx of internal hemrorhoids on colnnooscopy in 2021. ? ?Pt tried preparation h recenlty and only helped a little. ? ? ?Pt states this is first time with hemorrhoid like this. ? ? ?Review of Systems  ?Constitutional:  Negative for chills, fatigue and fever.  ?Respiratory:  Negative for cough, choking, shortness of breath and wheezing.   ?Cardiovascular:  Negative for chest pain and palpitations.  ?Gastrointestinal:  Positive for rectal pain. Negative for abdominal pain, constipation, diarrhea and nausea.  ?Genitourinary:  Negative for difficulty urinating, enuresis and flank pain.  ?Musculoskeletal:  Negative for back pain and joint swelling.  ? ?Past Medical History:  ?Diagnosis Date  ? Helicobacter pylori infection 10/2001  ? treated  ? ?  ?Social History  ? ?Socioeconomic History  ? Marital status: Married  ?  Spouse name: Not on file  ? Number of children: 1  ? Years of education: 6  ? Highest education level: Not on file  ?Occupational History  ?  Comment: assembling filters for [employer]  ?Tobacco Use  ? Smoking status: Never  ? Smokeless tobacco: Never  ?Vaping Use  ? Vaping Use: Never used  ?Substance and Sexual Activity  ? Alcohol use: Yes  ?  Alcohol/week: 2.0 standard drinks  ?  Types: 2 Standard drinks or equivalent per week  ?  Comment: sometimes  ? Drug use: No  ? Sexual activity: Yes  ?  Partners: Female  ?Other Topics Concern  ? Not on file  ?Social History Narrative  ? Native of [place], came to [place]. At age 55  ? Roofing and construction  ? Lives with Samuella Cota  ? Boy born in 2003  ? Caffeine - tea, one glass a day  ? ?Social Determinants of  Health  ? ?Financial Resource Strain: Not on file  ?Food Insecurity: Not on file  ?Transportation Needs: Not on file  ?Physical Activity: Not on file  ?Stress: Not on file  ?Social Connections: Not on file  ?Intimate Partner Violence: Not on file  ? ? ?Past Surgical History:  ?Procedure Laterality Date  ? laceration head  1999  ? ? ?Family History  ?Problem Relation Age of Onset  ? Cancer Father   ?     gastric  ? ? ?No Known Allergies ? ?No current outpatient medications on file prior to visit.  ? ?No current facility-administered medications on file prior to visit.  ? ? ?BP 108/70   Pulse 70   Resp 18   Ht '5\' 5"'$  (1.651 m)   Wt 138 lb 6.4 oz (62.8 kg)   SpO2 99%   BMI 23.03 kg/m?  ?  ?   ?Objective:  ? Physical Exam ?General- No acute distress. Pleasant patient. ?Neck- Full range of motion, no jvd ?Lungs- Clear, even and unlabored. ?Heart- regular rate and rhythm. ?Neurologic- CNII- XII grossly intact.  ?Rectal- rt side rectum moderate large and thrombosing hemorrhoid. ? ? ? ?   ?Assessment & Plan:  ? ?Patient Instructions  ?You appear to have moderate large early thrombosing hemorrhoid. Recommend hydrating well, sitz bath and  rx proctofoam to use 3 times daily. ? ?Placing referral to your GI MD for further evaluation and treatment. ? ?If pain worsens pending referral let me know.  ? ?Follow up 7-10 days or sooner if needed.  ?

## 2022-01-01 NOTE — Patient Instructions (Addendum)
You appear to have moderate large early thrombosing hemorrhoid. Recommend hydrating well, sitz bath and rx proctofoam to use 3 times daily. ? ?Placing referral to your GI MD for further evaluation and treatment. ? ?If pain worsens pending referral let me know.  ? ?Follow up 7-10 days or sooner if needed. ? ?Hemorroides ?Hemorrhoids ?Las hemorroides son venas inflamadas que pueden desarrollarse: ?En el ano (recto). Estas se denominan hemorroides internas. ?Alrededor de la abertura del ano. Estas se denominan hemorroides externas. ?Las hemorroides pueden Engineer, drilling, picaz?n o hemorragias. Generalmente no causan problemas graves. Con frecuencia mejoran al [employer] dieta, el estilo de vida y otros tratamientos Financial planner. ??Cu?les son las causas? ?Esta afecci?n puede ser causada por lo siguiente: ?Tener dificultad para defecar (estre?imiento). ?Hacer mucha fuerza (esfuerzo) para defecar. ?Materia fecal l?quida (diarrea). ?Embarazo. ?Tener mucho sobrepeso (obesidad). ?Estar sentado durante largos per?odos de tiempo. ?Levantar objetos pesados u otras actividades que impliquen esfuerzo. ?Sexo anal. ?Andar en bicicleta por un largo per?odo de tiempo. ??Cu?les son los signos o los s?ntomas? ?Los s?ntomas de esta afecci?n incluyen: ?Dolor. ?Picaz?n o irritaci?n en el ano. ?Sangrado proveniente del ano. ?P?rdida de materia fecal. ?Inflamaci?n en la zona. ?Uno o m?s bultos alrededor de la abertura del ano. ??C?mo se diagnostica? ?A menudo un m?dico puede diagnosticar esta afecci?n al observar la zona afectada. El m?dico tambi?n puede: ?Realizar un examen que implica palpar la zona con la mano enguantada (examen rectal digital). ?Examinar el interior de la zona anal utilizando un peque?o tubo (anoscopio). ?Pedir an?lisis de sangre. Es posible que esto se realice si ha perdido Optometrist. ?Solicitarle un estudio que consiste en la observaci?n del interior del colon utilizando un tubo flexible con una c?mara en el extremo  (sigmoidoscopia o colonoscop?a). ??C?mo se trata? ?Esta afecci?n generalmente se puede tratar en el hogar. El m?dico puede indicarle que cambie de alimentaci?n, de estilo de vida o que trate de Physicist, medical. Si esto no da resultado, se pueden realizar procedimientos para extirpar las hemorroides o reducir su tama?o. Estos pueden implicar lo siguiente: ?Colocar bandas el?sticas en la base de las hemorroides para interrumpir la irrigaci?n de Herbalist. ?Inyectar un medicamento en las hemorroides para reducir su tama?o. ?Dirigir un tipo de energ?a de [employer] las hemorroides para Field seismologist que se caigan. ?Realizar una cirug?a para extirpar las hemorroides o cortar la irrigaci?n de [place]. ?Siga estas instrucciones en su casa: ?Comida y bebida ? ?Consuma alimentos con alto contenido de [place]. Entre ellos cereales integrales, frijoles, frutos secos, frutas y verduras. ?Preg?ntele a su m?dico acerca de tomar productos con fibra a?adida en ellos (complementos de fibra). ?Disminuya la cantidad de grasa de la dieta. Para esto, puede hacer lo siguiente: ?Coma productos l?cteos descremados. ?Coma menos carne roja. ?No consuma alimentos procesados. ?Beba suficiente l?quido para mantener la orina de color amarillo p?lido. ?Control del dolor y la hinchaz?n ? ?Tome un ba?o de agua tibia (ba?o de asiento) durante 20 minutos para Best boy. H?galo 3 o 4 veces al d?a. Puede hacer esto en una ba?era o usar un dispositivo port?til para ba?o de asiento que se coloca sobre el inodoro. ?Si se lo indican, aplique hielo sobre la zona dolorida. Puede ser beneficioso aplicar hielo entre los ba?os con agua tibia. ?Ponga el hielo en una bolsa pl?stica. ?Coloque una [employer] piel y Therapist, nutritional. ?Aplique el hielo durante 20 minutos, 2 o 3 veces por d?a. ?Instrucciones generales ?Use los medicamentos de venta libre  y los recetados solamente como se lo haya indicado el m?dico. ?Las [employer] y los medicamentos  pueden usarse como se lo hayan indicado. ?Haga ejercicio f?sico con frecuencia. Consulte al m?dico qu? tipos de ejercicios son mejores para usted y qu? cantidad. ?Vaya al ba?o cuando sienta ganas de defecar. No espere. ?Evite hacer demasiada fuerza al defecar. ?Mantenga el ano seco y limpio. Use papel higi?nico h?medo o toallitas humedecidas despu?s de defecar. ?No pase mucho tiempo sentado en el inodoro. ?Concurra a todas las visitas de seguimiento como se lo haya indicado el m?dico. Esto es importante. ?Comun?quese con un m?dico si: ?Tiene dolor e hinchaz?n que no mejoran con el tratamiento o los medicamentos. ?Tiene problemas para defecar. ?No puede defecar. ?Tiene dolor o hinchaz?n en la zona exterior de las hemorroides. ?Solicite ayuda de inmediato si tiene: ?Hemorragia que no se detiene. ?Resumen ?Las hemorroides son venas hinchadas en el ano o la zona que rodea el ano. ?Pueden causar dolor, picaz?n o sangrado. ?Consuma alimentos con alto contenido de [place]. Entre ellos cereales integrales, frijoles, frutos secos, frutas y verduras. ?Tome un ba?o de agua tibia (ba?o de asiento) durante 20 minutos para Best boy. H?galo 3 o 4 veces al d?a. ?Esta informaci?n no tiene Marine scientist el consejo del m?dico. Aseg?rese de hacerle al m?dico cualquier pregunta que tenga. ?Document Revised: 04/04/2021 Document Reviewed: 04/04/2021 ?Elsevier Patient Education ? [place]. ? ?

## 2022-01-02 ENCOUNTER — Ambulatory Visit: Payer: BC Managed Care – PPO | Admitting: Family Medicine

## 2022-01-02 ENCOUNTER — Telehealth: Payer: Self-pay | Admitting: Medical

## 2022-01-02 ENCOUNTER — Emergency Department (HOSPITAL_BASED_OUTPATIENT_CLINIC_OR_DEPARTMENT_OTHER)
Admission: EM | Admit: 2022-01-02 | Discharge: 2022-01-02 | Disposition: A | Discharge status: Home / Self Care | Payer: BC Managed Care – PPO | Attending: Emergency Medicine | Admitting: Emergency Medicine

## 2022-01-02 ENCOUNTER — Other Ambulatory Visit: Payer: Self-pay

## 2022-01-02 ENCOUNTER — Encounter (HOSPITAL_BASED_OUTPATIENT_CLINIC_OR_DEPARTMENT_OTHER): Payer: Self-pay

## 2022-01-02 DIAGNOSIS — R5383 Other fatigue: Secondary | ICD-10-CM | POA: Diagnosis not present

## 2022-01-02 DIAGNOSIS — K644 Residual hemorrhoidal skin tags: Secondary | ICD-10-CM | POA: Insufficient documentation

## 2022-01-02 DIAGNOSIS — K649 Unspecified hemorrhoids: Secondary | ICD-10-CM

## 2022-01-02 LAB — CBC WITH DIFFERENTIAL/PLATELET
Abs Immature Granulocytes: 0.02 10*3/uL (ref 0.00–0.07)
Basophils Absolute: 0 10*3/uL (ref 0.0–0.1)
Basophils Relative: 0 %
Eosinophils Absolute: 0 10*3/uL (ref 0.0–0.5)
Eosinophils Relative: 1 %
HCT: 40.2 % (ref 39.0–52.0)
Hemoglobin: 14 g/dL (ref 13.0–17.0)
Immature Granulocytes: 0 %
Lymphocytes Relative: 23 %
Lymphs Abs: 1.9 10*3/uL (ref 0.7–4.0)
MCH: 31.1 pg (ref 26.0–34.0)
MCHC: 34.8 g/dL (ref 30.0–36.0)
MCV: 89.3 fL (ref 80.0–100.0)
Monocytes Absolute: 0.6 10*3/uL (ref 0.1–1.0)
Monocytes Relative: 8 %
Neutro Abs: 5.7 10*3/uL (ref 1.7–7.7)
Neutrophils Relative %: 68 %
Platelets: 205 10*3/uL (ref 150–400)
RBC: 4.5 MIL/uL (ref 4.22–5.81)
RDW: 12.7 % (ref 11.5–15.5)
WBC: 8.3 10*3/uL (ref 4.0–10.5)
nRBC: 0 % (ref 0.0–0.2)

## 2022-01-02 LAB — OCCULT BLOOD X 1 CARD TO LAB, STOOL: Fecal Occult Bld: POSITIVE — AB

## 2022-01-02 LAB — BASIC METABOLIC PANEL
Anion gap: 6 (ref 5–15)
BUN: 16 mg/dL (ref 6–20)
CO2: 27 mmol/L (ref 22–32)
Calcium: 9.4 mg/dL (ref 8.9–10.3)
Chloride: 105 mmol/L (ref 98–111)
Creatinine, Ser: 0.77 mg/dL (ref 0.61–1.24)
GFR, Estimated: >60 mL/min (ref >60)
Glucose, Bld: 102 mg/dL — ABNORMAL HIGH (ref 70–99)
Potassium: 3.8 mmol/L (ref 3.5–5.1)
Sodium: 138 mmol/L (ref 135–145)

## 2022-01-02 MED ORDER — SITZ BATH MISC: 1.0000 "application " | Freq: Every day | 0 refills | Status: AC | PRN

## 2022-01-02 MED ORDER — LIDOCAINE 3 % EX CREA: 1.0000 "application " | TOPICAL_CREAM | Freq: Two times a day (BID) | CUTANEOUS | 0 refills | Status: AC

## 2022-01-02 NOTE — ED Triage Notes (Signed)
Pt report hemorrhoids bleeding this morning. No pain at this time . Pt using proctofoam ?

## 2022-01-02 NOTE — Telephone Encounter (Signed)
Patient states his Hemorrhoids are bleeding a lot today. He also states that the place he was referred to, called him and told him he needs to be referred to another place. Advised patient to wait until Luke Sloan was able to referrer him to another place and they would call him. Patient was triaged to RN due to the amount of bleeding he was experiencing.  ?

## 2022-01-02 NOTE — Addendum Note (Signed)
Addended by: Anabel Halon on: 01/02/2022 03:17 PM ? ? Modules accepted: Orders ? ?

## 2022-01-02 NOTE — Discharge Instructions (Addendum)
You were seen in the emergency department today for a hemorrhoid.  You have been referred to colorectal surgery to possibly have this removed.  I am prescribing you some lidocaine cream that you can use twice a day as needed for pain relief.  Please also continue to use the medication prescribed by your family medicine doctor.  Additionally you can use sitz bath's.  Try to sit in a comfortable position.  Please take stool softener such as Colace which you can get over-the-counter to prevent constipation.  Please return for significantly increased bleeding or shortness of breath or dizziness. ? ?Hoy lo vieron en el departamento de emergencias por una hemorroide. Se le ha derivado a una cirug?a colorrectal para posiblemente extirparlo. Le estoy recetando una crema de lidoca?na que puede usar dos veces al d?a seg?n sea necesario para Best boy. Contin?e usando tambi?n los medicamentos recetados por su m?dico de familia. Adem?s, puede usar ba?os de asiento. Trate de sentarse en una posici?n c?moda. Tome un ablandador de heces como Colace, que puede obtener sin receta para prevenir el estre?imiento. Vuelva si hay un aumento significativo del sangrado o dificultad para respirar o mareos. ?

## 2022-01-02 NOTE — Telephone Encounter (Signed)
Nurse Assessment ?Nurse: Velta Addison, RN, Crystal Date/Time (Eastern Time): 01/02/2022 3:07:44 PM ?Confirm and document reason for call. If ?symptomatic, describe symptoms. ?---Caller states that he has hemorrhoid pain and ?was seen for this by the PA yesterday. He is now ?experiencing rectal bleeding and not sure what to do. ?Caller states when he moves he bleeds. Never had ?before. Pain? A little, before it was more. Started this ?morning approx 9am without going to the bathroom ?Does the patient have any new or worsening ?symptoms? ---Yes ?Will a triage be completed? ---Yes ?Related visit to physician within the last 2 weeks? ---Yes ?Does the PT have any chronic conditions? (i.e. ?diabetes, asthma, this includes High risk factors for ?pregnancy, etc.) ?---No ?Is this a behavioral health or substance abuse call? ---No ?Guidelines ?Guideline Title Affirmed Question Affirmed Notes Nurse Date/Time (Eastern ?Time) ?Rectal Bleeding SEVERE rectal ?bleeding (large blood ?clots; constant or on ?and off bleeding) ?Velta Addison, RN, Crystal 01/02/2022 3:13:18 ?PM ?Disp. Time (Eastern ?Time) Disposition Final User ?PLEASE NOTE: All timestamps contained within this report are represented as [place] Standard Time. ?CONFIDENTIALTY NOTICE: This fax transmission is intended only for the addressee. It contains information that is legally privileged, confidential or ?otherwise protected from use or disclosure. If you are not the intended recipient, you are strictly prohibited from reviewing, disclosing, copying using ?or disseminating any of this information or taking any action in reliance on or regarding this information. If you have received this fax in error, please ?notify us immediately by telephone so that we can arrange for its return to [place]. Phone: [phone], Toll-Free: [phone], Fax: [phone] ?Page: 2 of 2 ?Call Id: [id number] ?01/02/2022 3:17:07 PM Go to ED Now Yes Velta Addison, RN, [place] ?Caller Disagree/Comply Comply ?Caller  Understands Yes ?PreDisposition Call Doctor ?Care Advice Given Per Guideline ?GO TO ED NOW: * You need to be seen in the Emergency Department. * Go to the ED at ___________ [place] now. ?Drive carefully. * Another adult should drive. NOTE TO TRIAGER - DRIVING: CARE ADVICE given per Rectal Bleeding (Adult) ?guideline. * Patient should not delay going to the emergency department. ?Comments ?User: Hamilton Capri, RN Date/Time Eilene Ghazi Time): 01/02/2022 3:15:33 PM ?Caller states it started without having BM, bleeding off and on since 9am. Has weakness but not severe. ?User: Hamilton Capri, RN Date/Time Eilene Ghazi Time): 01/02/2022 3:18:03 PM ?Caller states he is going to [facility] ?

## 2022-01-02 NOTE — ED Provider Notes (Signed)
?[facility] ?Provider Note ? ? ?CSN: [id number] ?Arrival date & time: 01/02/22  1549 ? ?  ? ?History ? ?Chief Complaint  ?Patient presents with  ? Hemorrhoids  ? ? ?Luke Sloan is a 51 y.o. male. With past medical history of hematochezia, H.pylori who presents to the emergency department with rectal pain. ? ?States that he has had rectal pain and bleeding over the weekend.  He states that he has had bright red blood that initially started out on the toilet paper but has progressed since yesterday to being on his underwear, the seat he was sitting on and within the toilet bowl.  He states that it is dripping blood when he is on the toilet.  He also endorses some mild fatigue but no lightheadedness or dizziness.  He denies shortness of breath.  Denies abdominal pain, nausea, vomiting.  He does state that he intermittently has constipation and straining.  Is having difficulty with sitting due to pain.  Having pain with defecation.  He denies anticoagulation use.  He states that he was seen yesterday at family medicine for the same thing and was diagnosed with a hemorrhoid.  He was given GI follow-up.  He states that he called GI today and they said that "they only deal with internal hemorrhoids and not external" and he was referred to another GI. Has been using proctofoam without relief. ? ?HPI ? ?  ? ?Home Medications ?Prior to Admission medications   ?Medication Sig Start Date End Date Taking? Authorizing Provider  ?hydrocortisone-pramoxine (PROCTOFOAM-HC) rectal foam Place 1 applicator rectally 3 (three) times daily. 01/01/22  Yes Saguier, Percell Miller, PA-C  ?   ? ?Allergies    ?Patient has no known allergies.   ? ?Review of Systems   ?Review of Systems  ?Constitutional:  Positive for fatigue.  ?Gastrointestinal:  Positive for anal bleeding and rectal pain. Negative for abdominal pain.  ?All other systems reviewed and are negative. ? ?Physical Exam ?Updated Vital Signs ?BP 140/78  (BP Location: Left Arm)   Pulse 84   Temp 98.6 ?F (37 ?C) (Oral)   Resp 18   SpO2 96%  ?Physical Exam ?Vitals and nursing note reviewed. Exam conducted with a chaperone present.  ?Constitutional:   ?   General: He is not in acute distress. ?   Appearance: Normal appearance. He is not ill-appearing or toxic-appearing.  ?HENT:  ?   Head: Normocephalic and atraumatic.  ?   Mouth/Throat:  ?   Mouth: Mucous membranes are moist.  ?   Pharynx: Oropharynx is clear.  ?Eyes:  ?   General: No scleral icterus. ?   Extraocular Movements: Extraocular movements intact.  ?Cardiovascular:  ?   Rate and Rhythm: Normal rate and regular rhythm.  ?   Pulses: Normal pulses.  ?   Heart sounds: No murmur heard. ?Pulmonary:  ?   Effort: Pulmonary effort is normal. No respiratory distress.  ?Abdominal:  ?   General: Bowel sounds are normal. There is no distension.  ?   Palpations: Abdomen is soft.  ?   Tenderness: There is no abdominal tenderness.  ?Genitourinary: ?   Rectum: Guaiac result positive. Tenderness and external hemorrhoid present. No internal hemorrhoid.  ?   Comments: 1-1.5 cm external thrombosed hemorrhoid at the rectum.  Pain with digital exam.  There is obvious bright red blood on his buttock and over the hemorrhoid.  Did not palpate internal hemorrhoids.  No anal fissure. ?Musculoskeletal:     ?  General: Normal range of motion.  ?   Cervical back: Neck supple.  ?Skin: ?   General: Skin is warm and dry.  ?   Capillary Refill: Capillary refill takes less than 2 seconds.  ?   Findings: No rash.  ?Neurological:  ?   General: No focal deficit present.  ?   Mental Status: He is alert and oriented to person, place, and time. Mental status is at baseline.  ?Psychiatric:     ?   Mood and Affect: Mood normal.     ?   Behavior: Behavior normal.     ?   Thought Content: Thought content normal.     ?   Judgment: Judgment normal.  ? ? ?ED Results / Procedures / Treatments   ?Labs ?(all labs ordered are listed, but only abnormal  results are displayed) ?Labs Reviewed  ?OCCULT BLOOD X 1 CARD TO LAB, STOOL - Abnormal; Notable for the following components:  ?    Result Value  ? Fecal Occult Bld POSITIVE (*)   ? All other components within normal limits  ?BASIC METABOLIC PANEL - Abnormal; Notable for the following components:  ? Glucose, Bld 102 (*)   ? All other components within normal limits  ?CBC WITH DIFFERENTIAL/PLATELET  ? ?EKG ?None ? ?Radiology ?No results found. ? ?Procedures ?Procedures  ? ?Medications Ordered in ED ?Medications - No data to display ? ?ED Course/ Medical Decision Making/ A&P ?  ?                        ?Medical Decision Making ?Amount and/or Complexity of Data Reviewed ?Labs: ordered. ? ?Risk ?OTC drugs. ? ?This patient presents to the ED for concern of rectal pain, this involves an extensive number of treatment options, and is a complaint that carries with it a high risk of complications and morbidity.  The differential diagnosis includes hemorrhoids, anal fissure, mass, etc. ? ?Co morbidities that complicate the patient evaluation ?Hemorrhoids, constipation ? ?Additional history obtained:  ?Additional history obtained from: None ?External records from outside source obtained and reviewed including: Family medicine provider note from yesterday ? ?EKG: ?Not required ? ?Cardiac Monitoring: ?not required ? ?Lab Results: ?I personally ordered, reviewed, and interpreted labs. ?Pertinent results include: ?Guaiac positive ?CBC no leukocytosis, hemoglobin stable at 14 ?BMP within normal limits ? ?Imaging Studies ordered:  ?No imaging ? ?Medications  ?I ordered medication including n/a for n/a ?Reevaluation of the patient after medication shows that patient  n/a ?-I reviewed the patient's home medications and did not make adjustments. ?-I did  prescribe new home medications. ? ?Tests Considered: ?CT A/P with contrast - however have source of rectal bleeding so will defer  ? ?Critical Interventions: ?N/a ? ?Consultations: ?Not  required ? ?SDH ?None identified  ? ?ED Course: ?51 year old male who presents to the emergency department for rectal bleeding. ? ?Physical exam as noted above with bright red blood at the anus.  There is an external hemorrhoid that appears to have started bleeding.  A digital rectal exam finds no masses or internal hemorrhoids.  Not impacted.  He is otherwise well-appearing, nontoxic in appearance, euvolemic in appearance. ?Given his complaint of fatigue, obtained labs with stable hemoglobin of 14.  Orthostatic vital signs stable.  Do not feel he needs fluids or blood at this time. ? ?He has a thrombosed external hemorrhoid that appeared to have started bleeding.  It is not profusely bleeding requiring intervention at this time.  He has already been referred to colorectal surgery by his PCP so I will not rerefer at this time.  I will give him a prescription for lidocaine cream and he can continue to use Proctofoam medication.  Can also use sitz bath.  Discussed this with him.  He verbalized understanding.  Given return precautions for significantly worsening bleeding, shortness of breath or lightheadedness, dizziness, syncope.  He verbalized understanding.  All of care was performed with professional translator. ? ?After consideration of the diagnostic results and the patients response to treatment, I feel that the patent would benefit from discharge. ?The patient has been appropriately medically screened and/or stabilized in the ED. I have low suspicion for any other emergent medical condition which would require further screening, evaluation or treatment in the ED or require inpatient management. The patient is overall well appearing and non-toxic in appearance. They are hemodynamically stable at time of discharge.   ?Final Clinical Impression(s) / ED Diagnoses ?Final diagnoses:  ?Bleeding hemorrhoid  ? ? ?Rx / DC Orders ?ED Discharge Orders   ? ?      Ordered  ?  Lidocaine 3 % CREA  2 times daily       ?  01/02/22 1757  ?  Misc. Devices (SITZ BATH) MISC  Daily PRN       ? 01/02/22 1757  ? ?  ?  ? ?  ? ? ?  ?Mickie Hillier, PA-C ?01/02/22 1804 ? ?  ?Tegeler, Gwenyth Allegra, MD ?01/02/22 2016 ? ?

## 2022-01-03 NOTE — Telephone Encounter (Signed)
Patient advised of referral and provided with their phone number ?

## 2022-01-03 NOTE — Telephone Encounter (Signed)
Can you call patient and let them know they were referred to [facility] ?

## 2022-01-16 DIAGNOSIS — Z86010 Personal history of colonic polyps: Secondary | ICD-10-CM | POA: Diagnosis not present

## 2022-01-16 DIAGNOSIS — K625 Hemorrhage of anus and rectum: Secondary | ICD-10-CM | POA: Diagnosis not present

## 2022-01-16 DIAGNOSIS — K648 Other hemorrhoids: Secondary | ICD-10-CM | POA: Diagnosis not present

## 2024-07-06 ENCOUNTER — Encounter: Payer: Self-pay | Admitting: Medical

## 2024-07-06 ENCOUNTER — Ambulatory Visit: Admitting: Medical

## 2024-07-06 VITALS — BP 128/78 | HR 63 | Temp 98.4°F | Resp 15 | Ht 65.0 in | Wt 138.2 lb

## 2024-07-06 DIAGNOSIS — Z0000 Encounter for general adult medical examination without abnormal findings: Secondary | ICD-10-CM

## 2024-07-06 DIAGNOSIS — Z125 Encounter for screening for malignant neoplasm of prostate: Secondary | ICD-10-CM

## 2024-07-06 DIAGNOSIS — Z23 Encounter for immunization: Secondary | ICD-10-CM | POA: Diagnosis not present

## 2024-07-06 DIAGNOSIS — Z1159 Encounter for screening for other viral diseases: Secondary | ICD-10-CM

## 2024-07-06 NOTE — Patient Instructions (Addendum)
 For you wellness exam today I have ordered cbc, cmp, psa, hep c antibody and  lipid panel.(Labs future fasting)  Vaccine given today flu and shingrix  Up to date on colonoscopy  Recommend exercise and healthy diet.  We will let you know lab results as they come in.  Follow up date appointment will be determined after lab review.    Preventive Care 52-51 Years Old, Male Preventive care refers to lifestyle choices and visits with your health care provider that can promote health and wellness. Preventive care visits are also called wellness exams. What can I expect for my preventive care visit? Counseling During your preventive care visit, your health care provider may ask about your: Medical history, including: Past medical problems. Family medical history. Current health, including: Emotional well-being. Home life and relationship well-being. Sexual activity. Lifestyle, including: Alcohol, nicotine or tobacco, and drug use. Access to firearms. Diet, exercise, and sleep habits. Safety issues such as seatbelt and bike helmet use. Sunscreen use. Work and work astronomer. Physical exam Your health care provider will check your: Height and weight. These may be used to calculate your BMI (body mass index). BMI is a measurement that tells if you are at a healthy weight. Waist circumference. This measures the distance around your waistline. This measurement also tells if you are at a healthy weight and may help predict your risk of certain diseases, such as type 2 diabetes and high blood pressure. Heart rate and blood pressure. Body temperature. Skin for abnormal spots. What immunizations do I need?  Vaccines are usually given at various ages, according to a schedule. Your health care provider will recommend vaccines for you based on your age, medical history, and lifestyle or other factors, such as travel or where you work. What tests do I need? Screening Your health care provider  may recommend screening tests for certain conditions. This may include: Lipid and cholesterol levels. Diabetes screening. This is done by checking your blood sugar (glucose) after you have not eaten for a while (fasting). Hepatitis B test. Hepatitis C test. HIV (human immunodeficiency virus) test. STI (sexually transmitted infection) testing, if you are at risk. Lung cancer screening. Prostate cancer screening. Colorectal cancer screening. Talk with your health care provider about your test results, treatment options, and if necessary, the need for more tests. Follow these instructions at home: Eating and drinking  Eat a diet that includes fresh fruits and vegetables, whole grains, lean protein, and low-fat dairy products. Take vitamin and mineral supplements as recommended by your health care provider. Do not drink alcohol if your health care provider tells you not to drink. If you drink alcohol: Limit how much you have to 0-2 drinks a day. Know how much alcohol is in your drink. In the [place] one drink equals one 12 oz bottle of beer (355 mL), one 5 oz glass of wine (148 mL), or one 1 oz glass of hard liquor (44 mL). Lifestyle Brush your teeth every morning and night with fluoride toothpaste. Floss one time each day. Exercise for at least 30 minutes 5 or more days each week. Do not use any products that contain nicotine or tobacco. These products include cigarettes, chewing tobacco, and vaping devices, such as e-cigarettes. If you need help quitting, ask your health care provider. Do not use drugs. If you are sexually active, practice safe sex. Use a condom or other form of protection to prevent STIs. Take aspirin only as told by your health care provider. Make sure that  you understand how much to take and what form to take. Work with your health care provider to find out whether it is safe and beneficial for you to take aspirin daily. Find healthy ways to manage stress, such  as: Meditation, yoga, or listening to music. Journaling. Talking to a trusted person. Spending time with friends and family. Minimize exposure to UV radiation to reduce your risk of skin cancer. Safety Always wear your seat belt while driving or riding in a vehicle. Do not drive: If you have been drinking alcohol. Do not ride with someone who has been drinking. When you are tired or distracted. While texting. If you have been using any mind-altering substances or drugs. Wear a helmet and other protective equipment during sports activities. If you have firearms in your house, make sure you follow all gun safety procedures. What's next? Go to your health care provider once a year for an annual wellness visit. Ask your health care provider how often you should have your eyes and teeth checked. Stay up to date on all vaccines. This information is not intended to replace advice given to you by your health care provider. Make sure you discuss any questions you have with your health care provider. Document Revised: 02/22/2021 Document Reviewed: 02/22/2021 Elsevier Patient Education  2024 [employer].

## 2024-07-06 NOTE — Progress Notes (Signed)
 Subjective:    Patient ID: Luke Sloan, male    DOB: [birth year], 53 y.o.   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 Pt walks 3 days a week. Eats healthy. Non smoker. 3 beers about every 6 months. Drinks tea but not sweet.    Update colonoscopy.  Will get psa for screening prostate cancer.   Pt got flu vaccine and shingrix today.    Review of Systems  Constitutional:  Negative for chills, fatigue and fever.  HENT:  Negative for congestion.   Respiratory:  Negative for chest tightness, shortness of breath and wheezing.   Cardiovascular:  Negative for chest pain and palpitations.  Gastrointestinal:  Negative for abdominal pain and blood in stool.  Genitourinary:  Negative for dysuria, frequency and penile discharge.  Musculoskeletal:  Negative for back pain, myalgias and neck stiffness.  Skin:  Negative for rash.  Neurological:  Negative for dizziness, syncope, weakness and light-headedness.  Hematological:  Negative for adenopathy.  Psychiatric/Behavioral:  Negative for behavioral problems and dysphoric mood.     Past Medical History:  Diagnosis Date   Helicobacter pylori infection 10/2001   treated     Social History   Socioeconomic History   Marital status: Married    Spouse name: Not on file   Number of children: 1   Years of education: 6   Highest education level: Not on file  Occupational History    Comment: assembling filters for [employer]  Tobacco Use   Smoking status: Never   Smokeless tobacco: Never  Vaping Use   Vaping status: Never Used  Substance and Sexual Activity   Alcohol use: Yes    Alcohol/week: 2.0 standard drinks of alcohol    Types: 2 Standard drinks or equivalent per week    Comment: sometimes   Drug use: No   Sexual activity: Yes    Partners: Female  Other Topics Concern   Not on file  Social History Narrative   Native of [place], came to [employer]. At age 57   Roofing and construction    Lives with Nikki Rusnak   Boy born in 2003   Caffeine - tea, one glass a day   Social Drivers of Health   Financial Resource Strain: Not on file  Food Insecurity: Not on file  Transportation Needs: Not on file  Physical Activity: Not on file  Stress: Not on file  Social Connections: Not on file  Intimate Partner Violence: Not on file    Past Surgical History:  Procedure Laterality Date   laceration head  1999    Family History  Problem Relation Age of Onset   Cancer Father        gastric    No Known Allergies  Current Outpatient Medications on File Prior to Visit  Medication Sig Dispense Refill   hydrocortisone -pramoxine (PROCTOFOAM -HC) rectal foam Place 1 applicator rectally 3 (three) times daily. 10 g 0   Lidocaine  3 % CREA Apply 1 application. topically 2 (two) times daily. Apply to hemorrhoid 85 g 0   Misc. Devices (SITZ BATH) MISC 1 application. by Does not apply route daily as needed. 5 each 0   No current facility-administered medications on file prior to visit.    BP 128/78   Pulse 63   Temp 98.4 F (36.9 C) (Oral)   Resp 15   Ht 5' 5 (1.651 m)   Wt 138 lb 3.2 oz (62.7 kg)  SpO2 99%   BMI 23.00 kg/m           Objective:   Physical Exam  General Mental Status- Alert. General Appearance- Not in acute distress.   Skin General: Color- Normal Color. Moisture- Normal Moisture.  Neck  No JVD.  Chest and Lung Exam Auscultation: Breath Sounds:-CTA  Cardiovascular Auscultation:Rythm- RRR Murmurs & Other Heart Sounds:Auscultation of the heart reveals- No Murmurs.  Abdomen Inspection:-Inspeection Normal. Palpation/Percussion:Note:No mass. Palpation and Percussion of the abdomen reveal- Non Tender, Non Distended + BS, no rebound or guarding.   Neurologic Cranial Nerve exam:- CN III-XII intact(No nystagmus), symmetric smile. Strength:- 5/5 equal and symmetric strength both upper and lower extremities.       Assessment & Plan:    Patient Instructions  For you wellness exam today I have ordered cbc, cmp, psa, hep c antibody and  lipid panel.  Vaccine given today flu and shingrix  Up to date on colonoscopy  Recommend exercise and healthy diet.  We will let you know lab results as they come in.  Follow up date appointment will be determined after lab review.     Trease Bremner, PA-C

## 2024-07-10 ENCOUNTER — Other Ambulatory Visit (INDEPENDENT_AMBULATORY_CARE_PROVIDER_SITE_OTHER)

## 2024-07-10 DIAGNOSIS — Z125 Encounter for screening for malignant neoplasm of prostate: Secondary | ICD-10-CM

## 2024-07-10 DIAGNOSIS — Z1159 Encounter for screening for other viral diseases: Secondary | ICD-10-CM | POA: Diagnosis not present

## 2024-07-10 DIAGNOSIS — Z0000 Encounter for general adult medical examination without abnormal findings: Secondary | ICD-10-CM

## 2024-07-10 LAB — COMPREHENSIVE METABOLIC PANEL WITH GFR
ALT: 20 U/L (ref 0–53)
AST: 19 U/L (ref 0–37)
Albumin: 4.8 g/dL (ref 3.5–5.2)
Alkaline Phosphatase: 63 U/L (ref 39–117)
BUN: 16 mg/dL (ref 6–23)
CO2: 32 meq/L (ref 19–32)
Calcium: 9.8 mg/dL (ref 8.4–10.5)
Chloride: 100 meq/L (ref 96–112)
Creatinine, Ser: 0.76 mg/dL (ref 0.40–1.50)
GFR: 104.08 mL/min (ref >60.00)
Glucose, Bld: 80 mg/dL (ref 70–99)
Potassium: 4 meq/L (ref 3.5–5.1)
Sodium: 139 meq/L (ref 135–145)
Total Bilirubin: 0.6 mg/dL (ref 0.2–1.2)
Total Protein: 7.7 g/dL (ref 6.0–8.3)

## 2024-07-10 LAB — CBC WITH DIFFERENTIAL/PLATELET
Basophils Absolute: 0 K/uL (ref 0.0–0.1)
Basophils Relative: 0.5 % (ref 0.0–3.0)
Eosinophils Absolute: 0.1 K/uL (ref 0.0–0.7)
Eosinophils Relative: 2.1 % (ref 0.0–5.0)
HCT: 42.8 % (ref 39.0–52.0)
Hemoglobin: 14.3 g/dL (ref 13.0–17.0)
Lymphocytes Relative: 35.7 % (ref 12.0–46.0)
Lymphs Abs: 1.5 K/uL (ref 0.7–4.0)
MCHC: 33.4 g/dL (ref 30.0–36.0)
MCV: 91.4 fl (ref 78.0–100.0)
Monocytes Absolute: 0.5 K/uL (ref 0.1–1.0)
Monocytes Relative: 11.6 % (ref 3.0–12.0)
Neutro Abs: 2.2 K/uL (ref 1.4–7.7)
Neutrophils Relative %: 50.1 % (ref 43.0–77.0)
Platelets: 222 K/uL (ref 150.0–400.0)
RBC: 4.68 Mil/uL (ref 4.22–5.81)
RDW: 13.4 % (ref 11.5–15.5)
WBC: 4.3 K/uL (ref 4.0–10.5)

## 2024-07-10 LAB — LIPID PANEL
Cholesterol: 221 mg/dL — ABNORMAL HIGH (ref 0–200)
HDL: 61.9 mg/dL (ref >39.00)
LDL Cholesterol: 139 mg/dL — ABNORMAL HIGH (ref 0–99)
NonHDL: 158.95
Total CHOL/HDL Ratio: 4
Triglycerides: 101 mg/dL (ref 0.0–149.0)
VLDL: 20.2 mg/dL (ref 0.0–40.0)

## 2024-07-10 LAB — PSA: PSA: 0.85 ng/mL (ref 0.10–4.00)

## 2024-07-11 ENCOUNTER — Ambulatory Visit: Payer: Self-pay | Admitting: Medical

## 2024-07-11 LAB — HEPATITIS C ANTIBODY: Hepatitis C Ab: NONREACTIVE

## 2024-07-21 NOTE — Telephone Encounter (Signed)
 Copied from CRM [id number]. Topic: General - Other >> Jul 21, 2024  3:19 PM Rosina BIRCH wrote: Reason for CRM: patient called wanting an update on his lab results because no one called him. I let the patient know that a mychart message was sent and I read it to him with the interpreter. Patient stated his family does not have any history with heart problems or stroke

## 2024-08-25 ENCOUNTER — Telehealth: Payer: Self-pay

## 2024-08-25 NOTE — Telephone Encounter (Signed)
 Copied from CRM #[id number]. Topic: Clinical - Medical Advice >> Aug 25, 2024  1:40 PM Deleta S wrote: Reason for CRM: patient would like to know when should he schedule back for second vaccine for shingles. Please contact the patient at [phone]

## 2024-08-25 NOTE — Telephone Encounter (Signed)
 Will be set up for 12/30 nurse visit 2nd shingles

## 2024-09-08 ENCOUNTER — Ambulatory Visit

## 2024-09-18 ENCOUNTER — Ambulatory Visit: Admitting: *Deleted

## 2024-09-18 DIAGNOSIS — Z23 Encounter for immunization: Secondary | ICD-10-CM | POA: Diagnosis not present

## 2024-09-18 NOTE — Progress Notes (Signed)
 Pt here for second shingles vaccine.  Vaccine give in left deltoid and pt tolerated well.
